# Patient Record
Sex: Female | Born: 1935 | Race: White | Hispanic: No | Smoking: Former smoker
Health system: Southern US, Community
[De-identification: ages and names within clinical notes are randomized; demographics above are authoritative.]

## PROBLEM LIST (undated history)

## (undated) DIAGNOSIS — I10 Essential (primary) hypertension: Secondary | ICD-10-CM

## (undated) DIAGNOSIS — F1011 Alcohol abuse, in remission: Secondary | ICD-10-CM

## (undated) DIAGNOSIS — K922 Gastrointestinal hemorrhage, unspecified: Secondary | ICD-10-CM

## (undated) DIAGNOSIS — E7800 Pure hypercholesterolemia, unspecified: Secondary | ICD-10-CM

## (undated) DIAGNOSIS — Z720 Tobacco use: Secondary | ICD-10-CM

## (undated) HISTORY — PX: APPENDECTOMY: SHX54

## (undated) HISTORY — PX: KNEE ARTHROSCOPY: SHX127

## (undated) HISTORY — PX: TONSILLECTOMY: SUR1361

## (undated) HISTORY — PX: CHOLECYSTECTOMY: SHX55

---

## 2015-10-31 ENCOUNTER — Encounter: Payer: Self-pay | Admitting: Medical Oncology

## 2015-10-31 ENCOUNTER — Inpatient Hospital Stay
Admission: EM | Admit: 2015-10-31 | Discharge: 2015-11-03 | DRG: 280 | Disposition: A | Discharge status: Skilled Nursing Facility | Payer: Medicare Other | Attending: Internal Medicine | Admitting: Internal Medicine

## 2015-10-31 ENCOUNTER — Emergency Department: Payer: Medicare Other

## 2015-10-31 DIAGNOSIS — R29898 Other symptoms and signs involving the musculoskeletal system: Secondary | ICD-10-CM | POA: Diagnosis present

## 2015-10-31 DIAGNOSIS — Z8249 Family history of ischemic heart disease and other diseases of the circulatory system: Secondary | ICD-10-CM

## 2015-10-31 DIAGNOSIS — I214 Non-ST elevation (NSTEMI) myocardial infarction: Principal | ICD-10-CM | POA: Diagnosis present

## 2015-10-31 DIAGNOSIS — G3184 Mild cognitive impairment, so stated: Secondary | ICD-10-CM | POA: Diagnosis present

## 2015-10-31 DIAGNOSIS — R7989 Other specified abnormal findings of blood chemistry: Secondary | ICD-10-CM

## 2015-10-31 DIAGNOSIS — Z90710 Acquired absence of both cervix and uterus: Secondary | ICD-10-CM

## 2015-10-31 DIAGNOSIS — M1990 Unspecified osteoarthritis, unspecified site: Secondary | ICD-10-CM | POA: Diagnosis present

## 2015-10-31 DIAGNOSIS — F17210 Nicotine dependence, cigarettes, uncomplicated: Secondary | ICD-10-CM | POA: Diagnosis present

## 2015-10-31 DIAGNOSIS — J209 Acute bronchitis, unspecified: Secondary | ICD-10-CM | POA: Diagnosis present

## 2015-10-31 DIAGNOSIS — R531 Weakness: Secondary | ICD-10-CM | POA: Diagnosis present

## 2015-10-31 DIAGNOSIS — I10 Essential (primary) hypertension: Secondary | ICD-10-CM | POA: Diagnosis present

## 2015-10-31 DIAGNOSIS — R778 Other specified abnormalities of plasma proteins: Secondary | ICD-10-CM

## 2015-10-31 DIAGNOSIS — W19XXXA Unspecified fall, initial encounter: Secondary | ICD-10-CM

## 2015-10-31 DIAGNOSIS — E876 Hypokalemia: Secondary | ICD-10-CM | POA: Diagnosis present

## 2015-10-31 DIAGNOSIS — Z880 Allergy status to penicillin: Secondary | ICD-10-CM | POA: Diagnosis not present

## 2015-10-31 DIAGNOSIS — Z882 Allergy status to sulfonamides status: Secondary | ICD-10-CM

## 2015-10-31 DIAGNOSIS — N390 Urinary tract infection, site not specified: Secondary | ICD-10-CM | POA: Diagnosis present

## 2015-10-31 DIAGNOSIS — I213 ST elevation (STEMI) myocardial infarction of unspecified site: Secondary | ICD-10-CM | POA: Diagnosis not present

## 2015-10-31 DIAGNOSIS — G9341 Metabolic encephalopathy: Secondary | ICD-10-CM | POA: Diagnosis present

## 2015-10-31 HISTORY — DX: Essential (primary) hypertension: I10

## 2015-10-31 HISTORY — DX: Alcohol abuse, in remission: F10.11

## 2015-10-31 HISTORY — DX: Tobacco use: Z72.0

## 2015-10-31 LAB — URINALYSIS COMPLETE WITH MICROSCOPIC (ARMC ONLY)
Bilirubin Urine: NEGATIVE
GLUCOSE, UA: NEGATIVE mg/dL
Nitrite: NEGATIVE
Protein, ur: 100 mg/dL — AB
SPECIFIC GRAVITY, URINE: 1.026 (ref 1.005–1.030)
Squamous Epithelial / LPF: NONE SEEN
pH: 5 (ref 5.0–8.0)

## 2015-10-31 LAB — TROPONIN I
TROPONIN I: 1.54 ng/mL — AB (ref <0.03)
Troponin I: 1.19 ng/mL (ref <0.03)

## 2015-10-31 LAB — LIPID PANEL
CHOL/HDL RATIO: 2.4 ratio
Cholesterol: 140 mg/dL (ref 0–200)
HDL: 59 mg/dL (ref >40)
LDL Cholesterol: 70 mg/dL (ref 0–99)
TRIGLYCERIDES: 54 mg/dL (ref <150)
VLDL: 11 mg/dL (ref 0–40)

## 2015-10-31 LAB — BASIC METABOLIC PANEL
Anion gap: 12 (ref 5–15)
BUN: 14 mg/dL (ref 6–20)
CALCIUM: 9.6 mg/dL (ref 8.9–10.3)
CO2: 23 mmol/L (ref 22–32)
CREATININE: 0.8 mg/dL (ref 0.44–1.00)
Chloride: 100 mmol/L — ABNORMAL LOW (ref 101–111)
GFR calc non Af Amer: >60 mL/min (ref >60)
Glucose, Bld: 125 mg/dL — ABNORMAL HIGH (ref 65–99)
Potassium: 3.4 mmol/L — ABNORMAL LOW (ref 3.5–5.1)
SODIUM: 135 mmol/L (ref 135–145)

## 2015-10-31 LAB — CBC
HCT: 49.8 % — ABNORMAL HIGH (ref 35.0–47.0)
Hemoglobin: 17.1 g/dL — ABNORMAL HIGH (ref 12.0–16.0)
MCH: 31.8 pg (ref 26.0–34.0)
MCHC: 34.2 g/dL (ref 32.0–36.0)
MCV: 92.8 fL (ref 80.0–100.0)
PLATELETS: 199 10*3/uL (ref 150–440)
RBC: 5.37 MIL/uL — AB (ref 3.80–5.20)
RDW: 13.1 % (ref 11.5–14.5)
WBC: 15.3 10*3/uL — ABNORMAL HIGH (ref 3.6–11.0)

## 2015-10-31 LAB — APTT: APTT: 28 s (ref 24–36)

## 2015-10-31 LAB — CK: Total CK: 1407 U/L — ABNORMAL HIGH (ref 38–234)

## 2015-10-31 LAB — PROTIME-INR
INR: 1.09
Prothrombin Time: 14.3 seconds (ref 11.4–15.0)

## 2015-10-31 LAB — TSH: TSH: 1.098 u[IU]/mL (ref 0.350–4.500)

## 2015-10-31 MED ORDER — ASPIRIN EC 325 MG PO TBEC
325.0000 mg | DELAYED_RELEASE_TABLET | Freq: Once | ORAL | Status: AC
  Administered 2015-10-31: 325 mg via ORAL

## 2015-10-31 MED ORDER — HEPARIN BOLUS VIA INFUSION
4000.0000 [IU] | Freq: Once | INTRAVENOUS | Status: AC
  Administered 2015-10-31: 4000 [IU] via INTRAVENOUS
  Filled 2015-10-31: qty 4000

## 2015-10-31 MED ORDER — LEVOFLOXACIN IN D5W 750 MG/150ML IV SOLN
750.0000 mg | INTRAVENOUS | Status: DC
  Administered 2015-10-31 – 2015-11-02 (×3): 750 mg via INTRAVENOUS
  Filled 2015-10-31 (×4): qty 150

## 2015-10-31 MED ORDER — SODIUM CHLORIDE 0.9% FLUSH
3.0000 mL | Freq: Two times a day (BID) | INTRAVENOUS | Status: DC
  Administered 2015-10-31 – 2015-11-02 (×4): 3 mL via INTRAVENOUS

## 2015-10-31 MED ORDER — ATORVASTATIN CALCIUM 20 MG PO TABS
40.0000 mg | ORAL_TABLET | Freq: Every day | ORAL | Status: DC
  Administered 2015-11-01 – 2015-11-03 (×3): 40 mg via ORAL
  Filled 2015-10-31 (×3): qty 2

## 2015-10-31 MED ORDER — ACETAMINOPHEN 650 MG RE SUPP: 650.0000 mg | Freq: Four times a day (QID) | RECTAL | Status: DC | PRN

## 2015-10-31 MED ORDER — ASPIRIN EC 81 MG PO TBEC
81.0000 mg | DELAYED_RELEASE_TABLET | Freq: Every day | ORAL | Status: DC
  Administered 2015-11-01 – 2015-11-03 (×3): 81 mg via ORAL
  Filled 2015-10-31 (×3): qty 1

## 2015-10-31 MED ORDER — ASPIRIN EC 325 MG PO TBEC
DELAYED_RELEASE_TABLET | ORAL | Status: AC
  Administered 2015-10-31: 325 mg via ORAL
  Filled 2015-10-31: qty 1

## 2015-10-31 MED ORDER — ACETAMINOPHEN 325 MG PO TABS
650.0000 mg | ORAL_TABLET | Freq: Four times a day (QID) | ORAL | Status: DC | PRN
  Administered 2015-11-03: 650 mg via ORAL
  Filled 2015-10-31: qty 2

## 2015-10-31 MED ORDER — SODIUM CHLORIDE 0.9 % IV SOLN
INTRAVENOUS | Status: AC
  Administered 2015-10-31 – 2015-11-01 (×2): via INTRAVENOUS

## 2015-10-31 MED ORDER — ONDANSETRON HCL 4 MG PO TABS
4.0000 mg | ORAL_TABLET | Freq: Four times a day (QID) | ORAL | Status: DC | PRN
  Filled 2015-10-31: qty 1

## 2015-10-31 MED ORDER — ONDANSETRON HCL 4 MG/2ML IJ SOLN: 4.0000 mg | Freq: Four times a day (QID) | INTRAMUSCULAR | Status: DC | PRN

## 2015-10-31 MED ORDER — POTASSIUM CHLORIDE CRYS ER 20 MEQ PO TBCR
40.0000 meq | EXTENDED_RELEASE_TABLET | Freq: Once | ORAL | Status: AC
Start: 2015-10-31 — End: 2015-10-31
  Administered 2015-10-31: 40 meq via ORAL
  Filled 2015-10-31: qty 2

## 2015-10-31 MED ORDER — METOPROLOL TARTRATE 25 MG PO TABS
25.0000 mg | ORAL_TABLET | Freq: Two times a day (BID) | ORAL | Status: DC
  Administered 2015-10-31 – 2015-11-01 (×2): 25 mg via ORAL
  Filled 2015-10-31 (×2): qty 1

## 2015-10-31 MED ORDER — HEPARIN (PORCINE) IN NACL 100-0.45 UNIT/ML-% IJ SOLN
800.0000 [IU]/h | INTRAMUSCULAR | Status: DC
  Administered 2015-10-31 (×2): 900 [IU]/h via INTRAVENOUS
  Filled 2015-10-31 (×2): qty 250

## 2015-10-31 NOTE — Progress Notes (Signed)
The patient is admitted to room 247 with the diagnosis of NSTEMI. Alert and oriented x 4. Denied any acute pain,  No acute respiratory distress noted. Tele box called to CCMD with a second verifier Crystal B. RN.  Patient was oriented to her room, call bell/ascom and staff. Skin assessment done with Crystal B. RN, noted redness to the left groin, skin tear to the left arm and scattered bruised on bilateral arms and sacrum. Bed alarm activated and the bed is in the lowest position. Will continue to monitor.

## 2015-10-31 NOTE — ED Provider Notes (Signed)
[facility] Emergency Department Provider Note    ____________________________________________  Time seen: ~1655  I have reviewed the triage vital signs and the nursing notes.   HISTORY  Chief Complaint Weakness   History limited by: Not Limited   HPI Joann Huang is a 82 y.o. female who presents to the emergency department brought in by daughter because of concerns for increasing weakness. The daughter states for the past month or so they have noticed some increasing weakness of the lower extremities. However for the past 2-3 days and is been acutely worse. The patient has had multiple falls. Has not been able to get herself off the floor off of the commode. The daughter has also noticed some increased confusion. The patient denies any chest pain or shortness of breath. No recent fevers. No bad odor pain to urination.     History reviewed. No pertinent past medical history.  There are no active problems to display for this patient.   No past surgical history on file.  No current outpatient prescriptions on file.  Allergies Penicillins and Sulfa antibiotics  No family history on file.  Social History Social History  Substance Use Topics  . Smoking status: Current Every Day Smoker  . Smokeless tobacco: None  . Alcohol Use: None    Review of Systems  Constitutional: Negative for fever. Cardiovascular: Negative for chest pain. Respiratory: Negative for shortness of breath. Gastrointestinal: Negative for abdominal pain, vomiting and diarrhea. Neurological: Negative for headaches, focal weakness or numbness.  10-point ROS otherwise negative.  ____________________________________________   PHYSICAL EXAM:  VITAL SIGNS: ED Triage Vitals  Enc Vitals Group     BP 10/31/15 1402 156/91 mmHg     Pulse Rate 10/31/15 1402 99     Resp 10/31/15 1402 20     Temp 10/31/15 1402 97.7 F (36.5 C)     Temp Source 10/31/15 1402 Oral     SpO2  10/31/15 1402 95 %     Weight --      Height 10/31/15 1402  (1.676 m)     Head Cir --      Peak Flow --      Pain Score 10/31/15 1402 7   Constitutional: Alert and oriented. Well appearing and in no distress. Eyes: Conjunctivae are normal. PERRL. Normal extraocular movements. ENT   Head: Normocephalic and atraumatic.   Nose: No congestion/rhinnorhea.   Mouth/Throat: Mucous membranes are moist.   Neck: No stridor. Hematological/Lymphatic/Immunilogical: No cervical lymphadenopathy. Cardiovascular: Normal rate, regular rhythm.  No murmurs, rubs, or gallops. Respiratory: Normal respiratory effort without tachypnea nor retractions. Breath sounds are clear and equal bilaterally. No wheezes/rales/rhonchi. Gastrointestinal: Soft and nontender. No distention.  Genitourinary: Deferred Musculoskeletal: Normal range of motion in all extremities. No joint effusions.  No lower extremity tenderness nor edema. Neurologic:  Normal speech and language. No gross focal neurologic deficits are appreciated.  Skin:  Skin is warm, dry and intact. No rash noted.  ____________________________________________    LABS (pertinent positives/negatives)  Labs Reviewed  BASIC METABOLIC PANEL - Abnormal; Notable for the following:    Potassium 3.4 (*)    Chloride 100 (*)    Glucose, Bld 125 (*)    All other components within normal limits  CBC - Abnormal; Notable for the following:    WBC 15.3 (*)    RBC 5.37 (*)    Hemoglobin 17.1 (*)    HCT 49.8 (*)    All other components within normal limits  TROPONIN  I - Abnormal; Notable for the following:    Troponin I 1.19 (*)    All other components within normal limits  URINALYSIS COMPLETEWITH MICROSCOPIC ([facility] ONLY)  CBG MONITORING, ED    UA pending at time of admission ____________________________________________   EKG  I, Phineas SemenGraydon Magally Vahle, attending physician, personally viewed and interpreted this EKG  EKG Time: 1415 Rate:  103 Rhythm: sinus tachycardia Axis: left axis deviation Intervals: qtc 497 QRS: narrow, q waves III, aVF, V1, V2, V3, V4 ST changes: no st elevation Impression: abnormal ekg   ____________________________________________    RADIOLOGY  CT head IMPRESSION: Age related brain atrophy and extensive white matter small vessel ischemic changes.  No acute process by noncontrast CT.  ____________________________________________   PROCEDURES  Procedure(s) performed: None  Critical Care performed: Yes, see critical care note(s)  CRITICAL CARE Performed by: Phineas SemenGOODMAN, Hula Tasso   Total critical care time: 30 minutes  Critical care time was exclusive of separately billable procedures and treating other patients.  Critical care was necessary to treat or prevent imminent or life-threatening deterioration.  Critical care was time spent personally by me on the following activities: development of treatment plan with patient and/or surrogate as well as nursing, discussions with consultants, evaluation of patient's response to treatment, examination of patient, obtaining history from patient or surrogate, ordering and performing treatments and interventions, ordering and review of laboratory studies, ordering and review of radiographic studies, pulse oximetry and re-evaluation of patient's condition.  ____________________________________________   INITIAL IMPRESSION / ASSESSMENT AND PLAN / ED COURSE  Pertinent labs & imaging results that were available during my care of the patient were reviewed by me and considered in my medical decision making (see chart for details).  Patient presented to the emergency department today accompanied by daughter because of concerns for increasing weakness. It does sound like the weakness began a worse for the past month or so however acutely worse past 2-3 days. Exam patient appears well without any concerning findings. Blood work however was concerning for  an elevated troponin. A CT head was checked given altered mental status. Patient was given aspirin. Patient will be admitted to the hospitalist service for further workup and evaluation. ____________________________________________   FINAL CLINICAL IMPRESSION(S) / ED DIAGNOSES  Final diagnoses:  Weakness  Elevated troponin     Note: This dictation was prepared with Dragon dictation. Any transcriptional errors that result from this process are unintentional    Phineas SemenGraydon Keishawn Darsey, MD 10/31/15 1911

## 2015-10-31 NOTE — ED Notes (Signed)
Pts daughter reports pt has progressively been getting weaker over the past 2 weeks. Pt fell this am, per pt she "slipped to the floor" and did not hit her head. Denies use of blood thinner. Reports pain to rt rib cage.

## 2015-10-31 NOTE — H&P (Addendum)
[facility] [place] [facility]   PATIENT NAME: Jabier Gaussudrey Haydon    MR#:  [id number]  DATE OF BIRTH:  1930-10-12  DATE OF ADMISSION:  10/31/2015  PRIMARY CARE PHYSICIAN: No PCP Per Patient   REQUESTING/REFERRING PHYSICIAN: Dr. Phineas SemenGraydon Goodman  CHIEF COMPLAINT:   Chief Complaint  Patient presents with  . Weakness    HISTORY OF PRESENT ILLNESS:   Jabier Gaussudrey Nierman  is a 82 y.o. female with a known history of Hypertension not in any medications, ongoing smoking, history of heavy alcohol abuse but quit about 25 years ago, who hasn't seen a physician in greater than 25 years comes to the hospital secondary to worsening weakness.  Daughter at bedside provides most of the history. Patient lives independently, has a cane to ambulate. Has been having progressive weakness due to arthritis over the last several months. Over the last couple of days she had trouble getting up from even her toilet seat. Patient usually does not complain anything. So the daughter is not aware if she is having any fevers or chills. Her appetite has been okay. She was noted to have increased frequency of urination and also incontinent episodes since yesterday. Has been having cough for a long time, no changes in that. Data son-in-law went to check on her and she was noted to be on the bathroom floor, conscious, fell from weakness. -White count elevated on the labs. Urine and chest x-ray are pending.  PAST MEDICAL HISTORY:   Past Medical History  Diagnosis Date  . Tobacco use   . History of alcohol abuse     quit > 25years ago  . HTN (hypertension)     Not on any medications    PAST SURGICAL HISTORY:   Past Surgical History  Procedure Laterality Date  . Appendectomy      SOCIAL HISTORY:   Social History  Substance Use Topics  . Smoking status: Current Every Day Smoker -- 1.00 packs/day    Types: Cigarettes  . Smokeless tobacco: Not on file  . Alcohol Use: No     Comment: h/o  heavy alcohol abuse, wuit 25years ago    FAMILY HISTORY:   Family History  Problem Relation Age of Onset  . CAD Mother     DRUG ALLERGIES:   Allergies  Allergen Reactions  . Penicillins   . Sulfa Antibiotics     REVIEW OF SYSTEMS:   Review of Systems  Constitutional: Positive for malaise/fatigue. Negative for fever, chills and weight loss.  HENT: Negative for ear discharge, ear pain, nosebleeds and tinnitus.   Eyes: Positive for blurred vision. Negative for double vision and photophobia.  Respiratory: Negative for cough, hemoptysis, shortness of breath and wheezing.   Cardiovascular: Negative for chest pain, palpitations, orthopnea and leg swelling.  Gastrointestinal: Positive for nausea. Negative for heartburn, vomiting, abdominal pain, diarrhea, constipation and melena.  Genitourinary: Positive for urgency and frequency. Negative for dysuria and hematuria.  Musculoskeletal: Negative for myalgias and neck pain.  Skin: Negative for rash.  Neurological: Positive for dizziness and weakness. Negative for tremors, sensory change, speech change, focal weakness and headaches.  Endo/Heme/Allergies: Does not bruise/bleed easily.  Psychiatric/Behavioral: Negative for depression.    MEDICATIONS AT HOME:   Prior to Admission medications   Not on File      VITAL SIGNS:  Blood pressure 156/91, pulse 99, temperature 97.7 F (36.5 C), temperature source Oral, resp. rate 20, height 5\' 6"  (1.676 m), weight 77 kg (169 lb 12.1 oz),  SpO2 95 %.  PHYSICAL EXAMINATION:   Physical Exam  GENERAL:  82 y.o.-year-old patient lying in the bed with no acute distress.  EYES: Pupils equal, round, reactive to light and accommodation. No scleral icterus. Extraocular muscles intact.  HEENT: Head atraumatic, normocephalic. Oropharynx and nasopharynx clear.  NECK:  Supple, no jugular venous distention. No thyroid enlargement, no tenderness.  LUNGS: Normal breath sounds bilaterally, no wheezing,  rales,rhonchi or crepitation. No use of accessory muscles of respiration. Decreased bibasilar breath sounds noted. CARDIOVASCULAR: S1, S2 normal. No murmurs, rubs, or gallops.  ABDOMEN: Soft, nontender, nondistended. Bowel sounds present. No organomegaly or mass.  EXTREMITIES: No pedal edema, cyanosis, or clubbing.  NEUROLOGIC: Cranial nerves II through XII are intact. Muscle strength 5/5 in all extremities. Sensation intact. Gait not checked. Global weakness noted. PSYCHIATRIC: The patient is alert and oriented x 3. Intermittent confusion noted. SKIN: No obvious rash, lesion, or ulcer.   LABORATORY PANEL:   CBC  Recent Labs Lab 10/31/15 1410  WBC 15.3*  HGB 17.1*  HCT 49.8*  PLT 199   ------------------------------------------------------------------------------------------------------------------  Chemistries   Recent Labs Lab 10/31/15 1410  NA 135  K 3.4*  CL 100*  CO2 23  GLUCOSE 125*  BUN 14  CREATININE 0.80  CALCIUM 9.6   ------------------------------------------------------------------------------------------------------------------  Cardiac Enzymes  Recent Labs Lab 10/31/15 1410  TROPONINI 1.19*   ------------------------------------------------------------------------------------------------------------------  RADIOLOGY:  Ct Head Wo Contrast  10/31/2015  CLINICAL DATA:  Progressive weakness, altered mental status, recent fall with head injury EXAM: CT HEAD WITHOUT CONTRAST TECHNIQUE: Contiguous axial images were obtained from the base of the skull through the vertex without intravenous contrast. COMPARISON:  None. FINDINGS: Brain: Age related brain atrophy evident with extensive chronic white matter microvascular ischemic changes throughout the cerebral hemispheres. No acute intracranial hemorrhage, mass lesion, definite new infarction, midline shift, herniation, hydrocephalus, or extra-axial fluid collection. Cisterns are patent. Cerebellar atrophy as well.  Vascular: No hyperdense vessel or unexpected calcification. Skull: Negative for fracture or focal lesion. Sinuses/Orbits: No acute findings. Other: None. IMPRESSION: Age related brain atrophy and extensive white matter small vessel ischemic changes. No acute process by noncontrast CT. Electronically Signed   By: Judie Petit.  Shick M.D.   On: 10/31/2015 17:31    EKG:   Orders placed or performed during the hospital encounter of 10/31/15  . ED EKG  . ED EKG    IMPRESSION AND PLAN:   Joletta Manner  is a 82 y.o. female with a known history of Hypertension not in any medications, ongoing smoking, history of heavy alcohol abuse but quit about 25 years ago, who hasn't seen a physician in greater than 25 years comes to the hospital secondary to worsening weakness.  #1 Weakness- r/o UTI or bronchitis - IV fluids. -Physical therapy consult - CT head with no acute findings- age related changes seen.  #2 elevated troponin-NSTEMI vs demand ischemia - no chest pain, no EKG changes - continue heparin drip, recycle troponins, monitor on tele - ECHO and cards consult - started metoprolol and statin - check CPK  #3 Leukocytosis- r/o infection  #4 Hypokalemia- being replaced  #5 HTN- not on meds at home, start metoprolol  #6 DVT Prophylaxis-- lovenox   All the records are reviewed and case discussed with ED provider. Management plans discussed with the patient, family and they are in agreement.  CODE STATUS: Full Code  TOTAL TIME TAKING CARE OF THIS PATIENT: 50 minutes.    Enid Baas M.D on 10/31/2015 at 6:38 PM  Between  7am to 6pm - Pager - [phone]  After 6pm go to [email or web address] - password [email or web address] [facility]  [facility]  Office  [phone]  CC: Primary care physician; No PCP Per Patient

## 2015-10-31 NOTE — ED Notes (Signed)
Patient transported to X-ray 

## 2015-10-31 NOTE — Consult Note (Signed)
Pharmacy Antibiotic Note  Joann Huang is a 82 y.o. female admitted on 10/31/2015 with UTI.  Pharmacy has been consulted for levofloxacin dosing.   Plan: levofloxacin 750mg  q 24 hours. Recommend for a total of 5 days  Follow cx results  Height: 5\' 6"  (167.6 cm) Weight: 161 lb 9.6 oz (73.3 kg) IBW/kg (Calculated) : 59.3  Temp (24hrs), Avg:97.7 F (36.5 C), Min:97.7 F (36.5 C), Max:97.7 F (36.5 C)   Recent Labs Lab 10/31/15 1410  WBC 15.3*  CREATININE 0.80    Estimated Creatinine Clearance: 54.6 mL/min (by C-G formula based on Cr of 0.8).    Allergies  Allergen Reactions  . Penicillins Other (See Comments)    Has patient had a PCN reaction causing immediate rash, facial/tongue/throat swelling, SOB or lightheadedness with hypotension: No Has patient had a PCN reaction causing severe rash involving mucus membranes or skin necrosis: No Has patient had a PCN reaction that required hospitalization No Has patient had a PCN reaction occurring within the last 10 years: No If all of the above answers are "NO", then may proceed with Cephalosporin use.  . Sulfa Antibiotics     Antimicrobials this admission: levofloxacin 7/21 >>    Dose adjustments this admission:   Microbiology results: 7/21 BCx: pending   Thank you for allowing pharmacy to be a part of this patient's care.  Olene FlossMelissa D [place] Pandey 10/31/2015 7:33 PM

## 2015-10-31 NOTE — Consult Note (Signed)
ANTICOAGULATION CONSULT NOTE - Initial Consult  Pharmacy Consult for heparin drip Indication: chest pain/ACS  Allergies  Allergen Reactions  . Penicillins   . Sulfa Antibiotics     Patient Measurements: Height: 5\' 6"  (167.6 cm) Weight: 169 lb 12.1 oz (77 kg) IBW/kg (Calculated) : 59.3 Heparin Dosing Weight: 73.3kg  Vital Signs: Temp: 97.7 F (36.5 C) (07/21 1402) Temp Source: Oral (07/21 1402) BP: 156/91 mmHg (07/21 1402) Pulse Rate: 99 (07/21 1402)  Labs:  Recent Labs  10/31/15 1410  HGB 17.1*  HCT 49.8*  PLT 199  CREATININE 0.80  TROPONINI 1.19*    Estimated Creatinine Clearance: 55.9 mL/min (by C-G formula based on Cr of 0.8).   Medical History: Past Medical History  Diagnosis Date  . Tobacco use   . History of alcohol abuse     quit > 25years ago  . HTN (hypertension)     Not on any medications    Medications:   (Not in a hospital admission)  Assessment: Pt is a 82 year old female who presents with increasing weakness. Pt troponin found to be elevated. Pharmacy consulted to dose heparin drip for possible ACS. Pt denies being on any anticoagulants. Baseline labs ordered  Goal of Therapy:  Heparin level 0.3-0.7 units/ml Monitor platelets by anticoagulation protocol: Yes   Plan:  Give 4000 units bolus x 1 Start heparin infusion at 900 units/hr Check anti-Xa level in 8 hours and daily while on heparin Continue to monitor H&H and platelets  Peighton Edgin D Rosaly Labarbera, Pharm.D Clinical Pharmacist   10/31/2015,6:38 PM

## 2015-11-01 ENCOUNTER — Inpatient Hospital Stay (HOSPITAL_COMMUNITY)
Admit: 2015-11-01 | Discharge: 2015-11-01 | Disposition: A | Discharge status: Home / Self Care | Payer: Medicare Other | Attending: Internal Medicine | Admitting: Internal Medicine

## 2015-11-01 DIAGNOSIS — I214 Non-ST elevation (NSTEMI) myocardial infarction: Principal | ICD-10-CM

## 2015-11-01 DIAGNOSIS — G9341 Metabolic encephalopathy: Secondary | ICD-10-CM

## 2015-11-01 DIAGNOSIS — I213 ST elevation (STEMI) myocardial infarction of unspecified site: Secondary | ICD-10-CM

## 2015-11-01 DIAGNOSIS — R29898 Other symptoms and signs involving the musculoskeletal system: Secondary | ICD-10-CM

## 2015-11-01 DIAGNOSIS — I10 Essential (primary) hypertension: Secondary | ICD-10-CM

## 2015-11-01 DIAGNOSIS — N390 Urinary tract infection, site not specified: Secondary | ICD-10-CM | POA: Diagnosis present

## 2015-11-01 LAB — BASIC METABOLIC PANEL
ANION GAP: 9 (ref 5–15)
BUN: 18 mg/dL (ref 6–20)
CHLORIDE: 101 mmol/L (ref 101–111)
CO2: 24 mmol/L (ref 22–32)
Calcium: 8.7 mg/dL — ABNORMAL LOW (ref 8.9–10.3)
Creatinine, Ser: 0.77 mg/dL (ref 0.44–1.00)
GFR calc non Af Amer: >60 mL/min (ref >60)
Glucose, Bld: 91 mg/dL (ref 65–99)
POTASSIUM: 4 mmol/L (ref 3.5–5.1)
SODIUM: 134 mmol/L — AB (ref 135–145)

## 2015-11-01 LAB — CBC
HEMATOCRIT: 45.7 % (ref 35.0–47.0)
HEMOGLOBIN: 15.7 g/dL (ref 12.0–16.0)
MCH: 31.7 pg (ref 26.0–34.0)
MCHC: 34.4 g/dL (ref 32.0–36.0)
MCV: 92 fL (ref 80.0–100.0)
Platelets: 165 10*3/uL (ref 150–440)
RBC: 4.97 MIL/uL (ref 3.80–5.20)
RDW: 13 % (ref 11.5–14.5)
WBC: 17.5 10*3/uL — AB (ref 3.6–11.0)

## 2015-11-01 LAB — ECHOCARDIOGRAM COMPLETE
HEIGHTINCHES: 66 in
Weight: 2585.55 oz

## 2015-11-01 LAB — TROPONIN I
TROPONIN I: 1.5 ng/mL — AB (ref <0.03)
TROPONIN I: 1.8 ng/mL — AB (ref <0.03)

## 2015-11-01 LAB — HEPARIN LEVEL (UNFRACTIONATED)
HEPARIN UNFRACTIONATED: 0.72 [IU]/mL — AB (ref 0.30–0.70)
Heparin Unfractionated: 0.63 IU/mL (ref 0.30–0.70)

## 2015-11-01 MED ORDER — GUAIFENESIN-DM 100-10 MG/5ML PO SYRP: 5.0000 mL | ORAL_SOLUTION | ORAL | Status: DC | PRN

## 2015-11-01 MED ORDER — METOPROLOL SUCCINATE ER 50 MG PO TB24
50.0000 mg | ORAL_TABLET | Freq: Every day | ORAL | Status: DC
  Administered 2015-11-01 – 2015-11-03 (×3): 50 mg via ORAL
  Filled 2015-11-01 (×3): qty 1

## 2015-11-01 MED ORDER — ENOXAPARIN SODIUM 80 MG/0.8ML ~~LOC~~ SOLN
1.0000 mg/kg | Freq: Two times a day (BID) | SUBCUTANEOUS | Status: AC
  Administered 2015-11-01 – 2015-11-02 (×3): 75 mg via SUBCUTANEOUS
  Filled 2015-11-01 (×3): qty 0.8

## 2015-11-01 MED ORDER — IPRATROPIUM-ALBUTEROL 0.5-2.5 (3) MG/3ML IN SOLN: 3.0000 mL | RESPIRATORY_TRACT | Status: DC | PRN

## 2015-11-01 MED ORDER — HYDRALAZINE HCL 20 MG/ML IJ SOLN
10.0000 mg | Freq: Four times a day (QID) | INTRAMUSCULAR | Status: DC | PRN
  Administered 2015-11-01 – 2015-11-02 (×2): 10 mg via INTRAVENOUS
  Filled 2015-11-01 (×2): qty 1

## 2015-11-01 MED ORDER — ENOXAPARIN SODIUM 40 MG/0.4ML ~~LOC~~ SOLN
40.0000 mg | SUBCUTANEOUS | Status: DC
  Administered 2015-11-03: 40 mg via SUBCUTANEOUS
  Filled 2015-11-01: qty 0.4

## 2015-11-01 MED ORDER — IPRATROPIUM-ALBUTEROL 0.5-2.5 (3) MG/3ML IN SOLN
3.0000 mL | RESPIRATORY_TRACT | Status: DC
  Filled 2015-11-01: qty 3

## 2015-11-01 MED ORDER — PREDNISONE 50 MG PO TABS
50.0000 mg | ORAL_TABLET | Freq: Every day | ORAL | Status: DC
  Administered 2015-11-01 – 2015-11-03 (×3): 50 mg via ORAL
  Filled 2015-11-01 (×2): qty 1

## 2015-11-01 MED ORDER — AMLODIPINE BESYLATE 5 MG PO TABS
5.0000 mg | ORAL_TABLET | Freq: Every day | ORAL | Status: DC
  Administered 2015-11-01 – 2015-11-03 (×3): 5 mg via ORAL
  Filled 2015-11-01 (×3): qty 1

## 2015-11-01 NOTE — Progress Notes (Signed)
Physical Therapy Evaluation Patient Details Name: JENNA ROUTZAHN MRN: [id number] DOB: 1931-01-23 Today's Date: 11/01/2015   History of Present Illness  Patient is an 82 y.o. female admitted on 31 October 2015 for progressive weakness and worsening urinary frequency/incontinence. PMH includes HTN, smoking, and alcohol abuse. Has not seen a doctor in 25 years.  Clinical Impression  Patient is a pleasant 82 y.o. Female who is slightly agitated upon PT entering room for evaluation, stating she wants to return home. Upon evaluation, patient demonstrates slight levels of confusion where PT was unable to determine if these were baseline deficits. For example, saying she received breakfast at 1 P.M. And being unable to differentiate right from left. Patient required moderate assistance to perform bed mobility, lacking ability to follow multi-step or one step commands for hand placement from PT. Upon sitting, CNA entered room and took patient's BP which was recorded as 189/114 mmHg. Took again in opposite arm with similar reading. Nurse notified, and PT returned patient to supine. While PT was unable to assess mobility with [facility], it is believed that patient demonstrated enough deficits in strength/mobility during bed mobility to require further f/u of skilled and progressive PT. Patient may also benefit from placement at SNF until better able to demonstrate independence with bed mobility/transfers/household ambulation.    Follow Up Recommendations SNF    Equipment Recommendations  Rolling walker with 5" wheels    Recommendations for Other Services       Precautions / Restrictions Precautions Precautions: Fall Restrictions Weight Bearing Restrictions: No      Mobility  Bed Mobility Overal bed mobility: Needs Assistance Bed Mobility: Supine to Sit;Sit to Supine     Supine to sit: Max assist Sit to supine: Mod assist   General bed mobility comments: Patient demonstrates inability to follow  commands to move from supine to sit, having tendency to grab PT's hands/arms. Required moderate assistance with LEs and to move to [facility] once returning to supine.  Transfers Overall transfer level:  (Unable to assess due to BP)                  Ambulation/Gait             General Gait Details: Unable to assess due to BP  Stairs            Wheelchair Mobility    Modified Rankin (Stroke Patients Only)       Balance Overall balance assessment: Needs assistance Sitting-balance support: Feet supported Sitting balance-Leahy Scale: Fair                                       Pertinent Vitals/Pain Pain Assessment: No/denies pain    Home Living Family/patient expects to be discharged to:: Private residence Living Arrangements: Alone Available Help at Discharge: Family;Available PRN/intermittently;Other (Comment) (Daughter lives 15 mins away) Type of Home: Apartment Home Access: Level entry     Home Layout: One level Home Equipment: Cane - single point Additional Comments: Patient bathes in sink. Uses SPC for household ambulation on R.    Prior Function Level of Independence: Needs assistance   Gait / Transfers Assistance Needed: Modified independent with SPC  ADL's / Homemaking Assistance Needed: Modified independence with [facility]; daughter performs grocery shopping        Hand Dominance        Extremity/Trunk Assessment   Upper Extremity Assessment: Overall WFL for tasks  assessed           Lower Extremity Assessment: Overall WFL for tasks assessed         Communication   Communication: No difficulties  Cognition Arousal/Alertness: Awake/alert Behavior During Therapy: WFL for tasks assessed/performed;Agitated Overall Cognitive Status: Difficult to assess       Memory: Decreased short-term memory              General Comments      Exercises        Assessment/Plan    PT Assessment Patient needs continued PT  services  PT Diagnosis Difficulty walking   PT Problem List Decreased strength;Decreased activity tolerance;Decreased balance;Decreased cognition;Decreased knowledge of use of DME;Decreased safety awareness;Cardiopulmonary status limiting activity;Decreased mobility  PT Treatment Interventions DME instruction;Gait training;Functional mobility training;Therapeutic activities;Therapeutic exercise;Balance training;Cognitive remediation;Patient/family education   PT Goals (Current goals can be found in the Care Plan section) Acute Rehab PT Goals Patient Stated Goal: "To get out of here." PT Goal Formulation: With patient Time For Goal Achievement: 11/15/15 Potential to Achieve Goals: Good    Frequency Min 2X/week   Barriers to discharge Decreased caregiver support      Co-evaluation               End of Session   Activity Tolerance: Treatment limited secondary to medical complications (Comment) Patient left: in bed;with call bell/phone within reach;with bed alarm set Nurse Communication: Other (comment) (BP)         Time: 8757-9728 PT Time Calculation (min) (ACUTE ONLY): 27 min   Charges:   PT Evaluation $PT Eval Low Complexity: 1 Procedure     PT G Codes:        Neita Carp, PT, DPT 11/01/2015, 12:20 PM

## 2015-11-01 NOTE — Consult Note (Signed)
ANTICOAGULATION CONSULT NOTE - FOLLOW UP  Pharmacy Consult for heparin drip Indication: chest pain/ACS  Allergies  Allergen Reactions  . Sulfa Antibiotics Hives and Swelling  . Penicillins Hives and Other (See Comments)    Has patient had a PCN reaction causing immediate rash, facial/tongue/throat swelling, SOB or lightheadedness with hypotension: yes Has patient had a PCN reaction causing severe rash involving mucus membranes or skin necrosis: No Has patient had a PCN reaction that required hospitalization No Has patient had a PCN reaction occurring within the last 10 years: No If all of the above answers are "NO", then may proceed with Cephalosporin use.    Patient Measurements: Height: 5\' 6"  (167.6 cm) Weight: 161 lb 9.6 oz (73.3 kg) IBW/kg (Calculated) : 59.3 Heparin Dosing Weight: 73.3kg  Vital Signs: Temp: 97.8 F (36.6 C) (07/22 1136) Temp Source: Oral (07/22 1136) BP: 189/114 mmHg (07/22 1136) Pulse Rate: 80 (07/22 1136)  Labs:  Recent Labs  10/31/15 1410 10/31/15 1918 10/31/15 2046 11/01/15 0329 11/01/15 0837 11/01/15 1248  HGB 17.1*  --   --  15.7  --   --   HCT 49.8*  --   --  45.7  --   --   PLT 199  --   --  165  --   --   APTT  --  28  --   --   --   --   LABPROT  --  14.3  --   --   --   --   INR  --  1.09  --   --   --   --   HEPARINUNFRC  --   --   --  0.72*  --  0.63  CREATININE 0.80  --   --  0.77  --   --   CKTOTAL  --   --  1407*  --   --   --   TROPONINI 1.19*  --  1.54* 1.50* 1.80*  --     Estimated Creatinine Clearance: 54.6 mL/min (by C-G formula based on Cr of 0.77).   Medical History: Past Medical History  Diagnosis Date  . Tobacco use   . History of alcohol abuse     quit > 25years ago  . HTN (hypertension)     Not on any medications    Medications:  Prescriptions prior to admission  Medication Sig Dispense Refill Last Dose  . acetaminophen (TYLENOL) 650 MG CR tablet Take 1,300 mg by mouth daily.   10/31/2015 at Unknown  time    Assessment: Pt is a 82 year old female who presents with increasing weakness. Pt troponin found to be elevated. Pharmacy consulted to dose heparin drip for possible ACS. Pt denies being on any anticoagulants. Baseline labs ordered  Goal of Therapy:  Heparin level 0.3-0.7 units/ml Monitor platelets by anticoagulation protocol: Yes   Plan:  First heparin level supratherapeutic. Decrease rate to 800 units/hr, will recheck heparin level in 8 hours.  7/22 @ 12:48 Heparin level resulted 0.63. Will continue heparin gtt at current rate. Will recheck heparin level @ 08:00. Pharmacy to follow.   Demetrius Charity, PharmD  BCPS Clinical Pharmacist 11/01/2015,2:12 PM

## 2015-11-01 NOTE — Care Management Important Message (Signed)
Important Message  Patient Details  Name: Joann Huang MRN: [id number] Date of Birth: 07-21-1930   Medicare Important Message Given:  Yes    Lawerance Sabal, RN 11/01/2015, 12:13 PMImportant Message  Patient Details  Name: Joann Huang MRN: [id number] Date of Birth: 20-Mar-1931   Medicare Important Message Given:  Yes    Lawerance Sabal, RN 11/01/2015, 12:13 PM

## 2015-11-01 NOTE — Consult Note (Signed)
ANTICOAGULATION CONSULT NOTE - Initial Consult  Pharmacy Consult for heparin drip Indication: chest pain/ACS  Allergies  Allergen Reactions  . Sulfa Antibiotics Hives and Swelling  . Penicillins Hives and Other (See Comments)    Has patient had a PCN reaction causing immediate rash, facial/tongue/throat swelling, SOB or lightheadedness with hypotension: yes Has patient had a PCN reaction causing severe rash involving mucus membranes or skin necrosis: No Has patient had a PCN reaction that required hospitalization No Has patient had a PCN reaction occurring within the last 10 years: No If all of the above answers are "NO", then may proceed with Cephalosporin use.    Patient Measurements: Height: 5\' 6"  (167.6 cm) Weight: 161 lb 9.6 oz (73.3 kg) IBW/kg (Calculated) : 59.3 Heparin Dosing Weight: 73.3kg  Vital Signs: Temp: 97.7 F (36.5 C) (07/21 2048) Temp Source: Oral (07/21 2048) BP: 172/83 mmHg (07/21 2048) Pulse Rate: 71 (07/21 2048)  Labs:  Recent Labs  10/31/15 1410 10/31/15 1918 10/31/15 2046 11/01/15 0329  HGB 17.1*  --   --  15.7  HCT 49.8*  --   --  45.7  PLT 199  --   --  165  APTT  --  28  --   --   LABPROT  --  14.3  --   --   INR  --  1.09  --   --   HEPARINUNFRC  --   --   --  0.72*  CREATININE 0.80  --   --  0.77  CKTOTAL  --   --  1407*  --   TROPONINI 1.19*  --  1.54* 1.50*    Estimated Creatinine Clearance: 54.6 mL/min (by C-G formula based on Cr of 0.77).   Medical History: Past Medical History  Diagnosis Date  . Tobacco use   . History of alcohol abuse     quit > 25years ago  . HTN (hypertension)     Not on any medications    Medications:  Prescriptions prior to admission  Medication Sig Dispense Refill Last Dose  . acetaminophen (TYLENOL) 650 MG CR tablet Take 1,300 mg by mouth daily.   10/31/2015 at Unknown time    Assessment: Pt is a 82 year old female who presents with increasing weakness. Pt troponin found to be elevated.  Pharmacy consulted to dose heparin drip for possible ACS. Pt denies being on any anticoagulants. Baseline labs ordered  Goal of Therapy:  Heparin level 0.3-0.7 units/ml Monitor platelets by anticoagulation protocol: Yes   Plan:  First heparin level supratherapeutic. Decrease rate to 800 units/hr, will recheck heparin level in 8 hours.  Ludger Bones A. [place], [place].D., BCPS Clinical Pharmacist 11/01/2015,4:53 AM

## 2015-11-01 NOTE — Progress Notes (Signed)
Patient refused svn treatment again. She states her breathing is fine and she is not here because of her breathing. Mild rhonci upper lobes that clear with cough. Room air saturation 94% HR 97 RR 22

## 2015-11-01 NOTE — Progress Notes (Signed)
[facility] -  at [facility]   PATIENT NAME: Joann Huang    MR#:  [id number]  DATE OF BIRTH:  Sep 21, 1930  SUBJECTIVE:  CHIEF COMPLAINT:   Chief Complaint  Patient presents with  . Weakness   Afebrile. Continues to feel weak. Dry cough. Has been confused on and off as per daughter.  REVIEW OF SYSTEMS:    Review of Systems  Constitutional: Positive for malaise/fatigue. Negative for fever and chills.  HENT: Negative for sore throat.   Eyes: Negative for blurred vision, double vision and pain.  Respiratory: Positive for cough. Negative for hemoptysis, shortness of breath and wheezing.   Cardiovascular: Negative for chest pain, palpitations, orthopnea and leg swelling.  Gastrointestinal: Negative for heartburn, nausea, vomiting, abdominal pain, diarrhea and constipation.  Genitourinary: Negative for dysuria and hematuria.  Musculoskeletal: Negative for back pain and joint pain.  Skin: Negative for rash.  Neurological: Positive for weakness. Negative for sensory change, speech change, focal weakness and headaches.  Endo/Heme/Allergies: Does not bruise/bleed easily.  Psychiatric/Behavioral: Positive for hallucinations and memory loss. Negative for depression. The patient is not nervous/anxious.     DRUG ALLERGIES:   Allergies  Allergen Reactions  . Sulfa Antibiotics Hives and Swelling  . Penicillins Hives and Other (See Comments)    Has patient had a PCN reaction causing immediate rash, facial/tongue/throat swelling, SOB or lightheadedness with hypotension: yes Has patient had a PCN reaction causing severe rash involving mucus membranes or skin necrosis: No Has patient had a PCN reaction that required hospitalization No Has patient had a PCN reaction occurring within the last 10 years: No If all of the above answers are "NO", then may proceed with Cephalosporin use.    VITALS:  Blood pressure 189/114, pulse 80, temperature 97.8 F (36.6 C),  temperature source Oral, resp. rate 19, height 5\' 6"  (1.676 m), weight 73.3 kg (161 lb 9.6 oz), SpO2 98 %.  PHYSICAL EXAMINATION:   Physical Exam  GENERAL:  82 y.o.-year-old patient lying in the bed with no acute distress.  EYES: Pupils equal, round, reactive to light and accommodation. No scleral icterus. Extraocular muscles intact.  HEENT: Head atraumatic, normocephalic. Oropharynx and nasopharynx clear.  NECK:  Supple, no jugular venous distention. No thyroid enlargement, no tenderness.  LUNGS: Normal breath sounds bilaterally, no wheezing, rales, rhonchi. No use of accessory muscles of respiration.  CARDIOVASCULAR: S1, S2 normal. No murmurs, rubs, or gallops.  ABDOMEN: Soft, nontender, nondistended. Bowel sounds present. No organomegaly or mass.  EXTREMITIES: No cyanosis, clubbing or edema b/l.    NEUROLOGIC: Cranial nerves II through XII are intact. No focal  sensory deficits b/l.   Lower extremity motor strength is 4/5. PSYCHIATRIC: The patient is alert and awake. SKIN: No obvious rash, lesion, or ulcer.   LABORATORY PANEL:   CBC  Recent Labs Lab 11/01/15 0329  WBC 17.5*  HGB 15.7  HCT 45.7  PLT 165   ------------------------------------------------------------------------------------------------------------------ Chemistries   Recent Labs Lab 11/01/15 0329  NA 134*  K 4.0  CL 101  CO2 24  GLUCOSE 91  BUN 18  CREATININE 0.77  CALCIUM 8.7*   ------------------------------------------------------------------------------------------------------------------  Cardiac Enzymes  Recent Labs Lab 11/01/15 0837  TROPONINI 1.80*   ------------------------------------------------------------------------------------------------------------------  RADIOLOGY:  Dg Chest 2 View  10/31/2015  CLINICAL DATA:  Initial encounter for Pts daughter states she is getting weaker for the last two weeks now. Per pt she lifting something heacy a week ago and heard a popping sound.  Pt is complaining  of right sided rib pain since then. Smoker. Hx of HTN EXAM: CHEST  2 VIEW COMPARISON:  None. FINDINGS: Hyperinflation. Midline trachea. Normal heart size. Atherosclerosis in the transverse aorta. No pleural effusion or pneumothorax. There may be mild left base scarring. IMPRESSION: No acute cardiopulmonary disease. Aortic atherosclerosis. Electronically Signed   By: Jeronimo Greaves M.D.   On: 10/31/2015 19:19   Ct Head Wo Contrast  10/31/2015  CLINICAL DATA:  Progressive weakness, altered mental status, recent fall with head injury EXAM: CT HEAD WITHOUT CONTRAST TECHNIQUE: Contiguous axial images were obtained from the base of the skull through the vertex without intravenous contrast. COMPARISON:  None. FINDINGS: Brain: Age related brain atrophy evident with extensive chronic white matter microvascular ischemic changes throughout the cerebral hemispheres. No acute intracranial hemorrhage, mass lesion, definite new infarction, midline shift, herniation, hydrocephalus, or extra-axial fluid collection. Cisterns are patent. Cerebellar atrophy as well. Vascular: No hyperdense vessel or unexpected calcification. Skull: Negative for fracture or focal lesion. Sinuses/Orbits: No acute findings. Other: None. IMPRESSION: Age related brain atrophy and extensive white matter small vessel ischemic changes. No acute process by noncontrast CT. Electronically Signed   By: Judie Petit.  Shick M.D.   On: 10/31/2015 17:31     ASSESSMENT AND PLAN:   Joann Huang is a 82 y.o. female with a known history of Hypertension not in any medications, ongoing smoking, history of heavy alcohol abuse but quit about 25 years ago, who hasn't seen a physician in greater than 25 years comes to the hospital secondary to worsening weakness.  # UTI with weakness and acute encephalopathy On IV antibiotics. -Physical therapy consult - CT head with no acute findings- age related changes seen. She likely has mild cognitive  impairment.  # elevated troponin-NSTEMI vs rhabdomyolysis Discussed with Dr. Herbie [place] of cardiology [facility] continue anticoagulation for total 48 hours. We'll change heparin drip to Lovenox. Continue aspirin and beta blocker. Echocardiogram showed no wall motion abnormalities.  # Acute bronchitis Start prednisone and as needed breathing treatments.  # Hypokalemia- being replaced  # HTN- not on meds at home Will start Toprol and Norvasc Hydralazine PRN  # DVT Prophylaxis-- On lovenox  # Weakness This has been progressively worsening. Acute worsening due to UTI Will have physical therapy see the patient. May need rehabilitation at discharge.  All the records are reviewed and case discussed with Care Management/Social Workerr. Management plans discussed with the patient, family and they are in agreement.  CODE STATUS: FULL CODE  DVT Prophylaxis: SCDs  TOTAL TIME TAKING CARE OF THIS PATIENT: 35 minutes.   POSSIBLE D/C IN 2-3 DAYS, DEPENDING ON CLINICAL CONDITION.  Milagros Loll R M.D on 11/01/2015 at 2:35 PM  Between 7am to 6pm - Pager - [phone]  After 6pm go to [email or web address] - password [email or web address] [facility]  [facility]  Office  [phone]  CC: Primary care physician; No PCP Per Patient  Note: This dictation was prepared with Dragon dictation along with smaller phrase technology. Any transcriptional errors that result from this process are unintentional.

## 2015-11-01 NOTE — Consult Note (Signed)
Cardiology Consult    Patient ID: Joann Huang MRN: [id number], DOB/AGE: [birth year]   Admit date: 10/31/2015 Date of Consult: 11/01/2015  Primary Physician: No PCP Per Patient Primary Cardiologist: None. Requesting Provider: Milagros Loll, MD - Internal Medicine Hospitalist Service  Patient Profile    82 year old woman with a history of hypertension but not on any medications, long-term smoker without a diagnosis of COPD who has also been bothered by significant osteoporosis related pains and weakness. Has some balance issues and is been using a cane to ambulate., But has noted progressively worsening balance issues.  History of Present Illness   Joann Huang presented to the emergency room yesterday having been down the floor in her bathroom. She's been noticing progressively worsening balance issues and weakness.   History obtained from chart and discussion with the patient's daughter.--   These symptoms have been exacerbated over the last couple weeks as indicated by discussion with her daughter (the patient herself is a poor historian).  Apparently a couple days ago she was so weak that she could not get off the toilet, the following day she was actually found by this son-in-law on the floor in the bathroom. She states that she thought that she may been on before for at least 3 hours before was found. She denies any sensation of chest tightness pressure or dyspnea, but is a very poor historian. She has been having some more congestion and coughing, but has not had PND or orthopnea. No rapid irregular heartbeats or palpitations. There was no loss of consciousness. She has noted that her appetite has been down for a while and has not been eating and drinking as well as usual.  Cardiovascular ROS: positive for - Weakness and cough with chronic baseline dyspnea negative for - chest pain, edema, irregular heartbeat, loss of consciousness, orthopnea, palpitations, paroxysmal nocturnal dyspnea,  rapid heart rate or TIA/amaurosis fugax   Past Medical History   Past Medical History  Diagnosis Date  . Tobacco use   . History of alcohol abuse     quit > 25years ago  . HTN (hypertension)     Not on any medications    Past Surgical History  Procedure Laterality Date  . Appendectomy       Allergies  Allergies  Allergen Reactions  . Sulfa Antibiotics Hives and Swelling  . Penicillins Hives and Other (See Comments)    Has patient had a PCN reaction causing immediate rash, facial/tongue/throat swelling, SOB or lightheadedness with hypotension: yes Has patient had a PCN reaction causing severe rash involving mucus membranes or skin necrosis: No Has patient had a PCN reaction that required hospitalization No Has patient had a PCN reaction occurring within the last 10 years: No If all of the above answers are "NO", then may proceed with Cephalosporin use.    Inpatient Medications    . amLODipine  5 mg Oral Daily  . aspirin EC  81 mg Oral Daily  . atorvastatin  40 mg Oral q1800  . [START ON 11/03/2015] enoxaparin (LOVENOX) injection  40 mg Subcutaneous Q24H  . enoxaparin (LOVENOX) injection  1 mg/kg Subcutaneous Q12H  . ipratropium-albuterol  3 mL Nebulization Q4H  . levofloxacin (LEVAQUIN) IV  750 mg Intravenous Q24H  . metoprolol succinate  50 mg Oral Daily  . predniSONE  50 mg Oral Q breakfast  . sodium chloride flush  3 mL Intravenous Q12H    Family History    Family History  Problem Relation Age of Onset  .  CAD Mother     Social History    Social History   Social History  . Marital Status: Widowed    Spouse Name: N/A  . Number of Children: N/A  . Years of Education: N/A   Occupational History  . Not on file.   Social History Main Topics  . Smoking status: Current Every Day Smoker -- 1.00 packs/day    Types: Cigarettes  . Smokeless tobacco: Not on file  . Alcohol Use: No     Comment: h/o heavy alcohol abuse, wuit 25years ago  . Drug Use: No  .  Sexual Activity: Not on file   Other Topics Concern  . Not on file   Social History Narrative   Lives at home by herself. Has a cane     Review of Systems    Review of Systems  Constitutional: Negative for fever and chills.  HENT: Positive for congestion.   Respiratory: Positive for cough (Cough with some congestion) and sputum production (Only white).   Cardiovascular:       Negative per history of present illness  Gastrointestinal: Positive for nausea. Negative for vomiting, diarrhea, constipation, blood in stool and melena.  Genitourinary: Positive for urgency (Per history of present illness).       Unable to obtain, no description of dysuria or hematuria  Musculoskeletal: Positive for joint pain (Knees and hips) and falls.  Neurological: Positive for dizziness and weakness (Global weakness, mostly leg and hip related). Negative for focal weakness and loss of consciousness.  Endo/Heme/Allergies: Does not bruise/bleed easily.  Psychiatric/Behavioral: Positive for hallucinations (Per discussion with the daughter, she has been talking to people that are not there. Also having some confabulation.) and memory loss.   All other systems reviewed and are otherwise negative except as noted above.  Physical Exam    Blood pressure 189/114, pulse 80, temperature 97.8 F (36.6 C), temperature source Oral, resp. rate 19, height 5\' 6"  (1.676 m), weight 161 lb 9.6 oz (73.3 kg), SpO2 98 %.  General: Pleasant, NAD.  Sitting up in bed trying to eat her meal.  Psych: somewhat blunted abrupt affect. Sometimes does not respond take questions or follow commands. Just Stares at me.  Neuro: Alert and oriented  to person and family members, is unsure of her location.  Moves all extremities spontaneously. CNII- XII grossly intact  HEENT: [place]/AT, EOMI, MMM, anicteric sclera  Neck: Supple without bruits or JVD. Lungs:  Resp regular and unlabored, CTA. Heart: RRR, normal S1 and S2. no s3, s4, or  murmurs. Abdomen: Soft, non-tender, non-distended, BS + x 4.  Extremities: No clubbing, cyanosis or edema. DP/PT/Radials 2+ and equal bilaterally.  Labs    Troponin (Point of Care Test) No results for input(s): TROPIPOC in the last 72 hours.  Recent Labs  10/31/15 1410 10/31/15 2046 11/01/15 0329 11/01/15 0837  CKTOTAL  --  1407*  --   --   TROPONINI 1.19* 1.54* 1.50* 1.80*   Lab Results  Component Value Date   WBC 17.5* 11/01/2015   HGB 15.7 11/01/2015   HCT 45.7 11/01/2015   MCV 92.0 11/01/2015   PLT 165 11/01/2015    Recent Labs Lab 11/01/15 0329  NA 134*  K 4.0  CL 101  CO2 24  BUN 18  CREATININE 0.77  CALCIUM 8.7*  GLUCOSE 91   Lab Results  Component Value Date   CHOL 140 10/31/2015   HDL 59 10/31/2015   LDLCALC 70 10/31/2015   TRIG 54 10/31/2015  No results found for: [facility]   Radiology Studies    Dg Chest 2 View  10/31/2015  CLINICAL DATA:  Initial encounter for Pts daughter states she is getting weaker for the last two weeks now. Per pt she lifting something heacy a week ago and heard a popping sound. Pt is complaining of right sided rib pain since then. Smoker. Hx of HTN EXAM: CHEST  2 VIEW COMPARISON:  None. FINDINGS: Hyperinflation. Midline trachea. Normal heart size. Atherosclerosis in the transverse aorta. No pleural effusion or pneumothorax. There may be mild left base scarring. IMPRESSION: No acute cardiopulmonary disease. Aortic atherosclerosis. Electronically Signed   By: Jeronimo Greaves M.D.   On: 10/31/2015 19:19   Ct Head Wo Contrast  10/31/2015  CLINICAL DATA:  Progressive weakness, altered mental status, recent fall with head injury EXAM: CT HEAD WITHOUT CONTRAST TECHNIQUE: Contiguous axial images were obtained from the base of the skull through the vertex without intravenous contrast. COMPARISON:  None. FINDINGS: Brain: Age related brain atrophy evident with extensive chronic white matter microvascular ischemic changes throughout the cerebral  hemispheres. No acute intracranial hemorrhage, mass lesion, definite new infarction, midline shift, herniation, hydrocephalus, or extra-axial fluid collection. Cisterns are patent. Cerebellar atrophy as well. Vascular: No hyperdense vessel or unexpected calcification. Skull: Negative for fracture or focal lesion. Sinuses/Orbits: No acute findings. Other: None. IMPRESSION: Age related brain atrophy and extensive white matter small vessel ischemic changes. No acute process by noncontrast CT. Electronically Signed   By: Judie Petit.  Shick M.D.   On: 10/31/2015 17:31    ECG & Cardiac Imaging     EKG 10/31/2015: Sinus tachycardia, rate 103. Likely left atrial enlargement with criteria for LVH. Poor anteriorly progression - cannot exclude anterior infarct, age indeterminant. Also inferior Q waves, cannot exclude inferior MI, age indeterminate.  otherwise normal axis, intervals and durations.    Transthoracic Echo 11/01/2015: Normal LV size with mild concentric LVH. Normal function EF 60-65% with no regional wall motion abnormality. GR 1 DD. Otherwise essentially normal.  Assessment & Plan    Principal Problem:   Weakness of both lower extremities - ? Baseline weakness & OA exacerbated by UTI/Bronchitis; PT/OT  - will likely need at lest short term, if not full time SNF    NSTEMI (non-ST elevated myocardial infarction) (HCC) vs. [place].  Plan is for medical management  It's hard to tell exactly what the etiology for the troponin elevation is. There clearly is no classic symptomatology for cardiac event. No chest tightness or pressure no dyspnea. No heart failure. Her echocardiogram is normal. EKG is essentially benign (then no regional wall motion abnormality would exclude prior infarct).  Mild LVH is easily explained by other hypertension.  Cannot exclude possibility of mild rhabdomyolysis as etiology since the CK is very elevated. The trend is very unusual also.  As we cannot be certain the true  etiology, I think is fine to treat with anticoagulation for about 2 days. Agree with switching to Lovenox.  Would do aspirin and statin as well as beta blocker.  After long discussion with the patient's daughter, I don't think that we need to look into any invasive or noninvasive ischemic evaluation at this point in time especially in light of her encephalopathy. Perhaps as an outpatient this can be considered.      Essential hypertension - appears to be somewhat difficult to control   With questionable cardiac ischemia, I agree with beta blocker and calcium channel blocker.  would not titrate to fast.  Since there is a potential cardiac etiology, nitroglycerin would also be acceptable.     UTI (urinary tract infection) - with Encephalopathy; Abx - per hospitalist service   I will reevaluate tomorrow, but will likely follow peripherally.   Long discussion with the patient and her daughter about potential etiology of the troponin elevation and their desires for further evaluation. Greater than 50% of the time was spent in direct consultation. Total time with patient and family 60 minutes.   Signed, Marykay Lex, M.D., M.S.  [facility]  [address] [place], [place] [address] [phone] Fax [phone]

## 2015-11-02 LAB — CBC
HEMATOCRIT: 42.7 % (ref 35.0–47.0)
HEMOGLOBIN: 14.7 g/dL (ref 12.0–16.0)
MCH: 31.7 pg (ref 26.0–34.0)
MCHC: 34.4 g/dL (ref 32.0–36.0)
MCV: 92.3 fL (ref 80.0–100.0)
Platelets: 138 10*3/uL — ABNORMAL LOW (ref 150–440)
RBC: 4.62 MIL/uL (ref 3.80–5.20)
RDW: 12.8 % (ref 11.5–14.5)
WBC: 16.4 10*3/uL — AB (ref 3.6–11.0)

## 2015-11-02 LAB — BASIC METABOLIC PANEL
Anion gap: 9 (ref 5–15)
BUN: 20 mg/dL (ref 6–20)
CHLORIDE: 98 mmol/L — AB (ref 101–111)
CO2: 23 mmol/L (ref 22–32)
CREATININE: 0.6 mg/dL (ref 0.44–1.00)
Calcium: 8.6 mg/dL — ABNORMAL LOW (ref 8.9–10.3)
GFR calc Af Amer: >60 mL/min (ref >60)
GLUCOSE: 85 mg/dL (ref 65–99)
POTASSIUM: 3.4 mmol/L — AB (ref 3.5–5.1)
SODIUM: 130 mmol/L — AB (ref 135–145)

## 2015-11-02 LAB — CBC WITH DIFFERENTIAL/PLATELET
Basophils Absolute: 0.1 10*3/uL (ref 0–0.1)
Basophils Relative: 0 %
EOS ABS: 0.1 10*3/uL (ref 0–0.7)
EOS PCT: 0 %
HCT: 44.9 % (ref 35.0–47.0)
Hemoglobin: 15.6 g/dL (ref 12.0–16.0)
LYMPHS ABS: 1.4 10*3/uL (ref 1.0–3.6)
LYMPHS PCT: 8 %
MCH: 31.8 pg (ref 26.0–34.0)
MCHC: 34.7 g/dL (ref 32.0–36.0)
MCV: 91.7 fL (ref 80.0–100.0)
MONO ABS: 1.6 10*3/uL — AB (ref 0.2–0.9)
MONOS PCT: 9 %
Neutro Abs: 14.6 10*3/uL — ABNORMAL HIGH (ref 1.4–6.5)
Neutrophils Relative %: 83 %
PLATELETS: 157 10*3/uL (ref 150–440)
RBC: 4.9 MIL/uL (ref 3.80–5.20)
RDW: 12.9 % (ref 11.5–14.5)
WBC: 17.8 10*3/uL — ABNORMAL HIGH (ref 3.6–11.0)

## 2015-11-02 LAB — MAGNESIUM: Magnesium: 1.7 mg/dL (ref 1.7–2.4)

## 2015-11-02 LAB — CK: Total CK: 1027 U/L — ABNORMAL HIGH (ref 38–234)

## 2015-11-02 NOTE — Progress Notes (Signed)
[facility] - [place] at [facility]   PATIENT NAME: Joann Huang    MR#:  [id number]  DATE OF BIRTH:  08/09/1930  SUBJECTIVE:  CHIEF COMPLAINT:   Chief Complaint  Patient presents with  . Weakness   Feels better today. Weakness persists.  REVIEW OF SYSTEMS:    Review of Systems  Constitutional: Positive for malaise/fatigue. Negative for fever and chills.  HENT: Negative for sore throat.   Eyes: Negative for blurred vision, double vision and pain.  Respiratory: Positive for cough. Negative for hemoptysis, shortness of breath and wheezing.   Cardiovascular: Negative for chest pain, palpitations, orthopnea and leg swelling.  Gastrointestinal: Negative for heartburn, nausea, vomiting, abdominal pain, diarrhea and constipation.  Genitourinary: Negative for dysuria and hematuria.  Musculoskeletal: Negative for back pain and joint pain.  Skin: Negative for rash.  Neurological: Positive for weakness. Negative for sensory change, speech change, focal weakness and headaches.  Endo/Heme/Allergies: Does not bruise/bleed easily.  Psychiatric/Behavioral: Positive for hallucinations and memory loss. Negative for depression. The patient is not nervous/anxious.     DRUG ALLERGIES:   Allergies  Allergen Reactions  . Sulfa Antibiotics Hives and Swelling  . Penicillins Hives and Other (See Comments)    Has patient had a PCN reaction causing immediate rash, facial/tongue/throat swelling, SOB or lightheadedness with hypotension: yes Has patient had a PCN reaction causing severe rash involving mucus membranes or skin necrosis: No Has patient had a PCN reaction that required hospitalization No Has patient had a PCN reaction occurring within the last 10 years: No If all of the above answers are "NO", then may proceed with Cephalosporin use.    VITALS:  Blood pressure (!) 164/89, pulse 75, temperature 98.1 F (36.7 C), temperature source Oral, resp. rate 18, height   (1.676 m), weight 73.3 kg (161 lb 9.6 oz), SpO2 96 %.  PHYSICAL EXAMINATION:   Physical Exam  GENERAL:  82 y.o.-year-old patient lying in the bed with no acute distress.  EYES: Pupils equal, round, reactive to light and accommodation. No scleral icterus. Extraocular muscles intact.  HEENT: Head atraumatic, normocephalic. Oropharynx and nasopharynx clear.  NECK:  Supple, no jugular venous distention. No thyroid enlargement, no tenderness.  LUNGS: Normal breath sounds bilaterally, no wheezing, rales, rhonchi. No use of accessory muscles of respiration.  CARDIOVASCULAR: S1, S2 normal. No murmurs, rubs, or gallops.  ABDOMEN: Soft, nontender, nondistended. Bowel sounds present. No organomegaly or mass.  EXTREMITIES: No cyanosis, clubbing or edema b/l.    NEUROLOGIC: Cranial nerves II through XII are intact. No focal  sensory deficits b/l.   Lower extremity motor strength is 4/5. PSYCHIATRIC: The patient is alert and awake. SKIN: No obvious rash, lesion, or ulcer.   LABORATORY PANEL:   CBC  Recent Labs Lab 11/02/15 0800  WBC 16.4*  HGB 14.7  HCT 42.7  PLT 138*   ------------------------------------------------------------------------------------------------------------------ Chemistries   Recent Labs Lab 11/02/15 0457  NA 130*  K 3.4*  CL 98*  CO2 23  GLUCOSE 85  BUN 20  CREATININE 0.60  CALCIUM 8.6*   ------------------------------------------------------------------------------------------------------------------  Cardiac Enzymes  Recent Labs Lab 11/01/15 0837  TROPONINI 1.80*   ------------------------------------------------------------------------------------------------------------------  RADIOLOGY:  Dg Chest 2 View  Result Date: 10/31/2015 CLINICAL DATA:  Initial encounter for Pts daughter states she is getting weaker for the last two weeks now. Per pt she lifting something heacy a week ago and heard a popping sound. Pt is complaining of right sided rib  pain since then. Smoker. Hx  of HTN EXAM: CHEST  2 VIEW COMPARISON:  None. FINDINGS: Hyperinflation. Midline trachea. Normal heart size. Atherosclerosis in the transverse aorta. No pleural effusion or pneumothorax. There may be mild left base scarring. IMPRESSION: No acute cardiopulmonary disease. Aortic atherosclerosis. Electronically Signed   By: Jeronimo Greaves M.D.   On: 10/31/2015 19:19   Ct Head Wo Contrast  Result Date: 10/31/2015 CLINICAL DATA:  Progressive weakness, altered mental status, recent fall with head injury EXAM: CT HEAD WITHOUT CONTRAST TECHNIQUE: Contiguous axial images were obtained from the base of the skull through the vertex without intravenous contrast. COMPARISON:  None. FINDINGS: Brain: Age related brain atrophy evident with extensive chronic white matter microvascular ischemic changes throughout the cerebral hemispheres. No acute intracranial hemorrhage, mass lesion, definite new infarction, midline shift, herniation, hydrocephalus, or extra-axial fluid collection. Cisterns are patent. Cerebellar atrophy as well. Vascular: No hyperdense vessel or unexpected calcification. Skull: Negative for fracture or focal lesion. Sinuses/Orbits: No acute findings. Other: None. IMPRESSION: Age related brain atrophy and extensive white matter small vessel ischemic changes. No acute process by noncontrast CT. Electronically Signed   By: Judie Petit.  Shick M.D.   On: 10/31/2015 17:31     ASSESSMENT AND PLAN:   Joann Huang is a 82 y.o. female with a known history of Hypertension not in any medications, ongoing smoking, history of heavy alcohol abuse but quit about 25 years ago, who hasn't seen a physician in greater than 25 years comes to the hospital secondary to worsening weakness.  # UTI with weakness and acute encephalopathy On IV antibiotics. -Physical therapy consult - CT head with no acute findings- age related changes seen. She likely has mild cognitive impairment.  # Elevated  troponin-NSTEMI vs rhabdomyolysis Discussed with Dr. Herbie Baltimore of cardiology [facility] continue anticoagulation for total 48 hours. On Lovenox Continue aspirin and beta blocker. Echocardiogram showed no wall motion abnormalities. Will need outpatient stress test  # Acute bronchitis - improving Started prednisone and as needed breathing treatments.  # Hypokalemia- replaced  # HTN- not on meds at home Will start Toprol and Norvasc Hydralazine PRN  # DVT Prophylaxis-- On lovenox  # Weakness This has been progressively worsening. Acute worsening due to UTI Seen by physical therapy .SNF at discharge   All the records are reviewed and case discussed with Care Management/Social Workerr. Management plans discussed with the patient, family and they are in agreement.  Discussed with daughter over the phone.  CODE STATUS: FULL CODE  DVT Prophylaxis: SCDs  TOTAL TIME TAKING CARE OF THIS PATIENT: 35 minutes.   POSSIBLE D/C IN 2-3 DAYS, DEPENDING ON CLINICAL CONDITION.  Milagros Loll R M.D on 11/02/2015 at 11:11 AM  Between 7am to 6pm - Pager - [phone]  After 6pm go to [email or web address] - password [email or web address] [facility]  [facility]  Office  [phone]  CC: Primary care physician; No PCP Per Patient  Note: This dictation was prepared with Dragon dictation along with smaller phrase technology. Any transcriptional errors that result from this process are unintentional.

## 2015-11-02 NOTE — Progress Notes (Signed)
    Patient briefly seen and evaluated today chart reviewed. Remains somewhat confused. Without family members around, still not able to get a good history of symptoms. She denies chest pain.  She is hemodynamically stable. Vital signs are stable on stable regimen.  See full consult note from yesterday. I think stopping anticoagulation to 48 hours is probably reasonable. As per discussion with the patient's daughter yesterday, I don't think further additional ischemic evaluation is warranted at this time.  Her daughter's husband sees Dr. Mariah Milling, and the family would like for Joann Huang to follow-up with him if necessary. I think it would not be unreasonable to have at least one outpatient evaluation to determine if it is even reasonable to consider outpatient nuclear stress test.   Will pass on to our schedulers to have this appointment arranged.  With no active cardiac symptoms. I will sign off for now.   Joann Huang, M.D., M.S.  [facility]  [address] [place], [place] [address] [phone] Fax [phone]

## 2015-11-02 NOTE — Clinical Social Work Note (Signed)
Clinical Social Work Assessment  Patient Details  Name: Joann Huang MRN: [id number] Date of Birth: 10/12/1930  Date of referral:  11/02/15               Reason for consult:  Discharge Planning                Permission sought to share information with:  Family Supports Permission granted to share information::  Yes, Verbal Permission Granted  Name::        Agency::     Relationship::   Joann Huang; Joann Huang )  Contact Information:     Housing/Transportation Living arrangements for the past 2 months:  Single Family Home Source of Information:  Adult Children, Patient Joann Huang; Joann Huang ) Patient Interpreter Needed:  None Criminal Activity/Legal Involvement Pertinent to Current Situation/Hospitalization:  No - Comment as needed Significant Relationships:  Adult Children Joann Huang; Joann Huang ) Lives with:  Self Do you feel safe going back to the place where you live?  Yes Need for family participation in patient care:  Yes (Comment) Joann Huang; Joann Huang )  Care giving concerns:  Patient and her family are interested in SNF placement at discharge.    Social Worker assessment / plan:  CSW met with patient, Joann Huang; Joann Huang at bedside. Introduced herself and her role. Per patient's Huang they feel that patient needs SNF at discharge. Stated eventually they'd like to transition patient into LTC or ALF but feels she needs to get her strength back up. Inquired about LTC medicaid. CSW provided [employer] and Adult Medicaid [place] list. CSW encouraged patient's family to go to the [facility] to begin patient's Medicaid application. CSW provided patient and her family with a list of SNFs in the area. They report their preference would be [facility]. Granted CSW verbal permission to send SNF referrals to SNFs in [place]. FL2/ PASRR completed, placed in MDs basket for cosign and faxed  to SNFs in [place]. Awaiting bed offers.   Employment status:  Retired Forensic scientist:  Medicare PT Recommendations:  [place] Overlook / Referral to community resources:  [place]  Patient/Family's Response to care:  Patient and her family are agreeable to SNF at discharge.   Patient/Family's Understanding of and Emotional Response to Diagnosis, Current Treatment, and Prognosis:  Patient and her family report they under stand patient's Diagnosis, Current Treatment, and Prognosis. Reported they feel that STR is the best option for patient at this time and appreciates CSW's assistance.   Emotional Assessment Appearance:  Appears stated age Attitude/Demeanor/Rapport:   (None) Affect (typically observed):  Accepting, Calm, Pleasant Orientation:  Oriented to Self, Oriented to Place, Oriented to Situation Alcohol / Substance use:  Not Applicable Psych involvement (Current and /or in the community):  No (Comment)  Discharge Needs  Concerns to be addressed:  Discharge Planning Concerns Readmission within the last 30 days:  No Current discharge risk:  Chronically ill Barriers to Discharge:  Continued Medical Work up   [employer], LCSW 11/02/2015, 4:24 PM

## 2015-11-02 NOTE — NC FL2 (Signed)
[place] MEDICAID FL2 LEVEL OF CARE SCREENING TOOL     IDENTIFICATION  Patient Name: Joann Huang Birthdate: [birth year] Sex: female Admission Date (Current Location): 10/31/2015  [place] and [place] Number:  Chiropodist and Address:  [facility], [address], [place], [place] [address]      Provider Number: [id number]  Attending Physician Name and Address:  Milagros Loll, MD  Relative Name and Phone Number:       Current Level of Care: Hospital Recommended Level of Care: Skilled Nursing Facility Prior Approval Number:    Date Approved/Denied:   PASRR Number:  [id number])  Discharge Plan: SNF    Current Diagnoses: Patient Active Problem List   Diagnosis Date Noted  . Weakness of both lower extremities 11/01/2015  . UTI (urinary tract infection) - with encephalopathy 11/01/2015  . Essential hypertension 11/01/2015  . Metabolic encephalopathy - related to UTI 11/01/2015  . NSTEMI (non-ST elevated myocardial infarction) (HCC) 10/31/2015    Orientation RESPIRATION BLADDER Height & Weight     Self, Place  Normal Incontinent Weight: 161 lb 9.6 oz (73.3 kg) Height:   (167.6 cm)  BEHAVIORAL SYMPTOMS/MOOD NEUROLOGICAL BOWEL NUTRITION STATUS   (None)  (None) Incontinent Diet (Heart)  AMBULATORY STATUS COMMUNICATION OF NEEDS Skin   Extensive Assist Verbally Other (Comment) (Incision Left Arm)                       Personal Care Assistance Level of Assistance  Bathing, Feeding, Dressing Bathing Assistance: Limited assistance Feeding assistance: Independent Dressing Assistance: Limited assistance     Functional Limitations Info  Sight, Hearing, Speech Sight Info: Adequate Hearing Info: Adequate Speech Info: Adequate    SPECIAL CARE FACTORS FREQUENCY  PT (By licensed PT), OT (By licensed OT)     PT Frequency:  (5) OT Frequency:  (5)            Contractures      Additional Factors Info  Code Status,  Allergies Code Status Info:  (Full Code) Allergies Info:  (Sulfa Antibiotics, Penicillins)           Current Medications (11/02/2015):  This is the current hospital active medication list Current Facility-Administered Medications  Medication Dose Route Frequency Provider Last Rate Last Dose  . acetaminophen (TYLENOL) tablet 650 mg  650 mg Oral Q6H PRN Enid Baas, MD       Or  . acetaminophen (TYLENOL) suppository 650 mg  650 mg Rectal Q6H PRN Enid Baas, MD      . amLODipine (NORVASC) tablet 5 mg  5 mg Oral Daily Srikar Sudini, MD   5 mg at 11/02/15 0930  . aspirin EC tablet 81 mg  81 mg Oral Daily Enid Baas, MD   81 mg at 11/02/15 0931  . atorvastatin (LIPITOR) tablet 40 mg  40 mg Oral q1800 Enid Baas, MD   40 mg at 11/01/15 1858  . [START ON 11/03/2015] enoxaparin (LOVENOX) injection 40 mg  40 mg Subcutaneous Q24H Srikar Sudini, MD      . enoxaparin (LOVENOX) injection 75 mg  1 mg/kg Subcutaneous Q12H Srikar Sudini, MD   75 mg at 11/02/15 0600  . guaiFENesin-dextromethorphan (ROBITUSSIN DM) 100-10 MG/5ML syrup 5 mL  5 mL Oral Q4H PRN Srikar Sudini, MD      . hydrALAZINE (APRESOLINE) injection 10 mg  10 mg Intravenous Q6H PRN Milagros Loll, MD   10 mg at 11/02/15 0930  . ipratropium-albuterol (DUONEB) 0.5-2.5 (3) MG/3ML  nebulizer solution 3 mL  3 mL Nebulization Q4H PRN Srikar Sudini, MD      . levofloxacin (LEVAQUIN) IVPB 750 mg  750 mg Intravenous Q24H Olene Floss, RPH   750 mg at 11/01/15 2136  . metoprolol succinate (TOPROL-XL) 24 hr tablet 50 mg  50 mg Oral Daily Milagros Loll, MD   50 mg at 11/02/15 0931  . ondansetron (ZOFRAN) tablet 4 mg  4 mg Oral Q6H PRN Enid Baas, MD       Or  . ondansetron (ZOFRAN) injection 4 mg  4 mg Intravenous Q6H PRN Enid Baas, MD      . predniSONE (DELTASONE) tablet 50 mg  50 mg Oral Q breakfast Milagros Loll, MD   50 mg at 11/02/15 0900  . sodium chloride flush (NS) 0.9 % injection 3 mL  3 mL Intravenous  Q12H Enid Baas, MD   3 mL at 11/01/15 2136     Discharge Medications: Please see discharge summary for a list of discharge medications.  Relevant Imaging Results:  Relevant Lab Results:   Additional Information  (SSN [id number])  Verta Ellen Sunkins, LCSW

## 2015-11-03 LAB — BASIC METABOLIC PANEL
Anion gap: 10 (ref 5–15)
BUN: 32 mg/dL — AB (ref 6–20)
CHLORIDE: 100 mmol/L — AB (ref 101–111)
CO2: 21 mmol/L — ABNORMAL LOW (ref 22–32)
CREATININE: 0.61 mg/dL (ref 0.44–1.00)
Calcium: 8.6 mg/dL — ABNORMAL LOW (ref 8.9–10.3)
Glucose, Bld: 88 mg/dL (ref 65–99)
POTASSIUM: 3.3 mmol/L — AB (ref 3.5–5.1)
SODIUM: 131 mmol/L — AB (ref 135–145)

## 2015-11-03 LAB — CBC WITH DIFFERENTIAL/PLATELET
BASOS ABS: 0.1 10*3/uL (ref 0–0.1)
Basophils Relative: 0 %
EOS ABS: 0 10*3/uL (ref 0–0.7)
EOS PCT: 0 %
HCT: 38.7 % (ref 35.0–47.0)
HEMOGLOBIN: 13.3 g/dL (ref 12.0–16.0)
LYMPHS PCT: 11 %
Lymphs Abs: 1.6 10*3/uL (ref 1.0–3.6)
MCH: 31.8 pg (ref 26.0–34.0)
MCHC: 34.4 g/dL (ref 32.0–36.0)
MCV: 92.4 fL (ref 80.0–100.0)
Monocytes Absolute: 2 10*3/uL — ABNORMAL HIGH (ref 0.2–0.9)
Monocytes Relative: 13 %
NEUTROS PCT: 76 %
Neutro Abs: 11.5 10*3/uL — ABNORMAL HIGH (ref 1.4–6.5)
PLATELETS: 170 10*3/uL (ref 150–440)
RBC: 4.19 MIL/uL (ref 3.80–5.20)
RDW: 13 % (ref 11.5–14.5)
WBC: 15.2 10*3/uL — AB (ref 3.6–11.0)

## 2015-11-03 LAB — CK: CK TOTAL: 577 U/L — AB (ref 38–234)

## 2015-11-03 MED ORDER — PREDNISONE 50 MG PO TABS: 50.0000 mg | ORAL_TABLET | Freq: Every day | ORAL | 0 refills | Status: AC

## 2015-11-03 MED ORDER — AMLODIPINE BESYLATE 5 MG PO TABS: 5.0000 mg | ORAL_TABLET | Freq: Every day | ORAL | 0 refills | Status: DC

## 2015-11-03 MED ORDER — ASPIRIN 81 MG PO TBEC: 81.0000 mg | DELAYED_RELEASE_TABLET | Freq: Every day | ORAL | 0 refills | Status: DC

## 2015-11-03 MED ORDER — METOPROLOL SUCCINATE ER 50 MG PO TB24: 50.0000 mg | ORAL_TABLET | Freq: Every day | ORAL | 0 refills | Status: AC

## 2015-11-03 MED ORDER — LEVOFLOXACIN 500 MG PO TABS: 500.0000 mg | ORAL_TABLET | Freq: Every day | ORAL | 0 refills | Status: AC

## 2015-11-03 MED ORDER — ATORVASTATIN CALCIUM 40 MG PO TABS: 40.0000 mg | ORAL_TABLET | Freq: Every day | ORAL | 0 refills | Status: DC

## 2015-11-03 MED ORDER — POTASSIUM CHLORIDE CRYS ER 20 MEQ PO TBCR
20.0000 meq | EXTENDED_RELEASE_TABLET | Freq: Two times a day (BID) | ORAL | Status: DC
  Filled 2015-11-03: qty 1

## 2015-11-03 NOTE — Progress Notes (Signed)
No complaints of pain or distress noted this shift.  Rhonchi in lung fields noted.  Pt noncompliant with requests to deep breath and cough.

## 2015-11-03 NOTE — Clinical Social Work Placement (Signed)
   CLINICAL SOCIAL WORK PLACEMENT  NOTE  Date:  11/03/2015  Patient Details  Name: Joann Huang MRN: [id number] Date of Birth: Feb 12, 1931  Clinical Social Work is seeking post-discharge placement for this patient at the Skilled  Nursing Facility level of care (*CSW will initial, date and re-position this form in  chart as items are completed):  Yes   Patient/family provided with [place] Clinical Social Work Department's list of facilities offering this level of care within the geographic area requested by the patient (or if unable, by the patient's family).  Yes   Patient/family informed of their freedom to choose among providers that offer the needed level of care, that participate in Medicare, Medicaid or managed care program needed by the patient, have an available bed and are willing to accept the patient.  Yes   Patient/family informed of [facility]'s ownership interest in [facility] and [facility], as well as of the fact that they are under no obligation to receive care at these facilities.  PASRR submitted to EDS on 11/03/15     PASRR number received on 11/03/15     Existing PASRR number confirmed on       FL2 transmitted to all facilities in geographic area requested by pt/family on 11/03/15     FL2 transmitted to all facilities within larger geographic area on       Patient informed that his/her managed care company has contracts with or will negotiate with certain facilities, including the following:        Yes   Patient/family informed of bed offers received.  Patient chooses bed at  [facility])     Physician recommends and patient chooses bed at      Patient to be transferred to  [facility]) on 11/03/15.  Patient to be transferred to facility by  (EMS)     Patient family notified on 11/03/15 of transfer.  Name of family member notified:   [name]- Daughter)     PHYSICIAN       Additional Comment:     _______________________________________________ Idamae Lusher, LCSW 11/03/2015, 2:23 PM

## 2015-11-03 NOTE — Care Management Important Message (Signed)
Important Message  Patient Details  Name: Joann Huang MRN: [id number] Date of Birth: Aug 18, 1930   Medicare Important Message Given:  Yes    Marily Memos, RN 11/03/2015, 9:58 AM

## 2015-11-03 NOTE — Progress Notes (Signed)
CSW contacted patient's daughter [name] over the phone [phone]). Presented bed offers. Accepted bed at [facility]. CSW informed Counsellor at [facility]. CSW will continue to follow and assist.  Woodroe Mode, MSW, LCSW, LCAS-A Clinical Social Worker [phone]

## 2015-11-03 NOTE — Progress Notes (Signed)
Clinical Social Worker was informed that patient will be medically ready to discharge to [facility] . Patient in a agreement with plan. CSW called Raiford Noble- Admissions Coordinator at [facility] to confirm that patient's bed is ready. Provided patient's room number E-15 and number to call for report [phone]. All discharge information faxed to [facility] via HUB. Call to patient's daughter- [name], informed her patient would discharge to [facility]. RN will call report and patient will discharge to [facility] via EMS.  Woodroe Mode, MSW, LCSW, LCAS-A Clinical Social Worker [phone]

## 2015-11-03 NOTE — Progress Notes (Signed)
Patient discharged to [facility] via EMS,vital signs within normal limits upon discharge

## 2015-11-03 NOTE — Discharge Summary (Signed)
[facility] - [place] at [facility]   PATIENT NAME: Joann Huang    MR#:  [id number]  DATE OF BIRTH:  1930/05/26  DATE OF ADMISSION:  10/31/2015 ADMITTING PHYSICIAN: Enid Baas, MD  DATE OF DISCHARGE: 11/03/2015  PRIMARY CARE PHYSICIAN: No PCP Per Patient    ADMISSION DIAGNOSIS:  Weakness [R53.1] Fall [W19.XXXA] Elevated troponin [R79.89]  DISCHARGE DIAGNOSIS:  Principal Problem:   Weakness of both lower extremities Active Problems:   NSTEMI (non-ST elevated myocardial infarction) (HCC)   UTI (urinary tract infection) - with encephalopathy   Essential hypertension   Metabolic encephalopathy - related to UTI   SECONDARY DIAGNOSIS:   Past Medical History:  Diagnosis Date  . History of alcohol abuse    quit > 25years ago  . HTN (hypertension)    Not on any medications  . Tobacco use     HOSPITAL COURSE:   # UTI with weakness and acute encephalopathy On IV antibiotics. -Physical therapy consult - CT head with no acute findings- age related changes seen. She likely has mild cognitive impairment.  levaquine oral on discharge.  # Elevated troponin-NSTEMI  Discussed with Dr. Herbie Baltimore of cardiology given anticoagulation for total 48 hours. On Lovenox Continue aspirin and beta blocker. Echocardiogram showed no wall motion abnormalities. Will need outpatient cardiology follow ups.  # Acute bronchitis - improving Started prednisone and as needed breathing treatments.  # Hypokalemia- replaced  # HTN- not on meds at home  Toprol and Norvasc Hydralazine PRN  # DVT Prophylaxis-- On lovenox  # Weakness This has been progressively worsening. Acute worsening due to UTI Seen by physical therapy .SNF at discharge  DISCHARGE CONDITIONS:   Stable.  CONSULTS OBTAINED:  Treatment Team:  Milagros Loll, MD  DRUG ALLERGIES:   Allergies  Allergen Reactions  . Sulfa Antibiotics Hives and Swelling  . Penicillins Hives and Other  (See Comments)    Has patient had a PCN reaction causing immediate rash, facial/tongue/throat swelling, SOB or lightheadedness with hypotension: yes Has patient had a PCN reaction causing severe rash involving mucus membranes or skin necrosis: No Has patient had a PCN reaction that required hospitalization No Has patient had a PCN reaction occurring within the last 10 years: No If all of the above answers are "NO", then may proceed with Cephalosporin use.    DISCHARGE MEDICATIONS:   Current Discharge Medication List    START taking these medications   Details  amLODipine (NORVASC) 5 MG tablet Take 1 tablet (5 mg total) by mouth daily. Qty: 30 tablet, Refills: 0    aspirin EC 81 MG EC tablet Take 1 tablet (81 mg total) by mouth daily. Qty: 30 tablet, Refills: 0    atorvastatin (LIPITOR) 40 MG tablet Take 1 tablet (40 mg total) by mouth daily at 6 PM. Qty: 30 tablet, Refills: 0    levofloxacin (LEVAQUIN) 500 MG tablet Take 1 tablet (500 mg total) by mouth daily. Qty: 3 tablet, Refills: 0    metoprolol succinate (TOPROL-XL) 50 MG 24 hr tablet Take 1 tablet (50 mg total) by mouth daily. Take with or immediately following a meal. Qty: 30 tablet, Refills: 0    predniSONE (DELTASONE) 50 MG tablet Take 1 tablet (50 mg total) by mouth daily with breakfast. Qty: 2 tablet, Refills: 0      CONTINUE these medications which have NOT CHANGED   Details  acetaminophen (TYLENOL) 650 MG CR tablet Take 1,300 mg by mouth daily.  DISCHARGE INSTRUCTIONS:    Follow with Dr. Mariah Milling in 1-2 weeks.  If you experience worsening of your admission symptoms, develop shortness of breath, life threatening emergency, suicidal or homicidal thoughts you must seek medical attention immediately by calling 911 or calling your MD immediately  if symptoms less severe.  You Must read complete instructions/literature along with all the possible adverse reactions/side effects for all the Medicines you take  and that have been prescribed to you. Take any new Medicines after you have completely understood and accept all the possible adverse reactions/side effects.   Please note  You were cared for by a hospitalist during your hospital stay. If you have any questions about your discharge medications or the care you received while you were in the hospital after you are discharged, you can call the unit and asked to speak with the hospitalist on call if the hospitalist that took care of you is not available. Once you are discharged, your primary care physician will handle any further medical issues. Please note that NO REFILLS for any discharge medications will be authorized once you are discharged, as it is imperative that you return to your primary care physician (or establish a relationship with a primary care physician if you do not have one) for your aftercare needs so that they can reassess your need for medications and monitor your lab values.    Today   CHIEF COMPLAINT:   Chief Complaint  Patient presents with  . Weakness    HISTORY OF PRESENT ILLNESS:  Marigny Borre  is a 82 y.o. female with a known history of Hypertension not in any medications, ongoing smoking, history of heavy alcohol abuse but quit about 25 years ago, who hasn't seen a physician in greater than 25 years comes to the hospital secondary to worsening weakness.  Daughter at bedside provides most of the history. Patient lives independently, has a cane to ambulate. Has been having progressive weakness due to arthritis over the last several months. Over the last couple of days she had trouble getting up from even her toilet seat. Patient usually does not complain anything. So the daughter is not aware if she is having any fevers or chills. Her appetite has been okay. She was noted to have increased frequency of urination and also incontinent episodes since yesterday. Has been having cough for a long time, no changes in that. Data  son-in-law went to check on her and she was noted to be on the bathroom floor, conscious, fell from weakness. -White count elevated on the labs. Urine and chest x-ray are pending.  VITAL SIGNS:  Blood pressure 127/82, pulse 73, temperature 97.6 F (36.4 C), temperature source Oral, resp. rate 19, height 5\' 6"  (1.676 m), weight 73.3 kg (161 lb 9.6 oz), SpO2 92 %.  I/O:   Intake/Output Summary (Last 24 hours) at 11/03/15 1320 Last data filed at 11/02/15 2100  Gross per 24 hour  Intake              150 ml  Output                0 ml  Net              150 ml    PHYSICAL EXAMINATION:   GENERAL:  82 y.o.-year-old patient lying in the bed with no acute distress.  EYES: Pupils equal, round, reactive to light and accommodation. No scleral icterus. Extraocular muscles intact.  HEENT: Head atraumatic, normocephalic. Oropharynx and nasopharynx clear.  NECK:  Supple, no jugular venous distention. No thyroid enlargement, no tenderness.  LUNGS: Normal breath sounds bilaterally, no wheezing, rales, rhonchi. No use of accessory muscles of respiration.  CARDIOVASCULAR: S1, S2 normal. No murmurs, rubs, or gallops.  ABDOMEN: Soft, nontender, nondistended. Bowel sounds present. No organomegaly or mass.  EXTREMITIES: No cyanosis, clubbing or edema b/l.    NEUROLOGIC: Cranial nerves II through XII are intact. No focal  sensory deficits b/l.   Lower extremity motor strength is 4/5. PSYCHIATRIC: The patient is alert and awake. SKIN: No obvious rash, lesion, or ulcer.  DATA REVIEW:   CBC  Recent Labs Lab 11/03/15 0517  WBC 15.2*  HGB 13.3  HCT 38.7  PLT 170    Chemistries   Recent Labs Lab 11/02/15 0457 11/03/15 0517  NA 130* 131*  K 3.4* 3.3*  CL 98* 100*  CO2 23 21*  GLUCOSE 85 88  BUN 20 32*  CREATININE 0.60 0.61  CALCIUM 8.6* 8.6*  MG 1.7  --     Cardiac Enzymes  Recent Labs Lab 11/01/15 0837  TROPONINI 1.80*    Microbiology Results  Results for orders placed or  performed during the hospital encounter of 10/31/15  Urine culture     Status: Abnormal (Preliminary result)   Collection Time: 10/31/15  6:19 PM  Result Value Ref Range Status   Specimen Description URINE, RANDOM  Final   Special Requests NONE  Final   Culture (A)  Final    >=100,000 COLONIES/mL GRAM NEGATIVE RODS REPEATING ID AND SUSCEPTIBILITIES Performed at [facility]    Report Status PENDING  Incomplete    RADIOLOGY:  No results found.  EKG:   Orders placed or performed during the hospital encounter of 10/31/15  . ED EKG  . ED EKG      Management plans discussed with the patient, family and they are in agreement.  CODE STATUS:     Code Status Orders        Start     Ordered   10/31/15 2028  Full code  Continuous     10/31/15 2027    Code Status History    Date Active Date Inactive Code Status Order ID Comments User Context   This patient has a current code status but no historical code status.    Advance Directive Documentation   Flowsheet Row Most Recent Value  Type of Advance Directive  Healthcare Power of Attorney  Pre-existing out of facility DNR order (yellow form or pink MOST form)  No data  "MOST" Form in Place?  No data      TOTAL TIME TAKING CARE OF THIS PATIENT: 35 minutes.    Altamese Dilling M.D on 11/03/2015 at 1:20 PM  Between 7am to 6pm - Pager - [phone]  After 6pm go to [email or web address] - Social research officer, government  [facility] [place] [facility]  Office  [phone]  CC: Primary care physician; No PCP Per Patient   Note: This dictation was prepared with Dragon dictation along with smaller phrase technology. Any transcriptional errors that result from this process are unintentional.

## 2015-11-03 NOTE — Progress Notes (Signed)
Physical Therapy Treatment Patient Details Name: Joann Huang MRN: [id number] DOB: Feb 02, 1931 Today's Date: 11/03/2015    History of Present Illness Patient is an 82 y.o. female admitted on 31 October 2015 for progressive weakness and worsening urinary frequency/incontinence. PMH includes HTN, smoking, and alcohol abuse. Has not seen a doctor in 25 years.    PT Comments    Pt agreeable to PT, but bed exercises only. Pt notes feeling extremely hot and pain in left hip. Pt given fan and cool cloths. Pt instructed in and performs Bilateral lower extremities exercises with assist and rest as needed. Pt instructed to continue exercises throughout the day and instructed in benefits of moving Left lower extremity to prevent stiffness and gain range and strength to reduce pain and gain function. Continue PT to progress strength and endurance to improve all functional mobility.   Follow Up Recommendations  SNF     Equipment Recommendations  Rolling walker with 5" wheels    Recommendations for Other Services       Precautions / Restrictions Restrictions Weight Bearing Restrictions: No    Mobility  Bed Mobility               General bed mobility comments: Not tested; refuses out of bed  Transfers                    Ambulation/Gait                 Stairs            Wheelchair Mobility    Modified Rankin (Stroke Patients Only)       Balance                                    Cognition Arousal/Alertness: Awake/alert Behavior During Therapy: WFL for tasks assessed/performed;Agitated Overall Cognitive Status: Within Functional Limits for tasks assessed                      Exercises General Exercises - Lower Extremity Ankle Circles/Pumps: AROM;Both;15 reps;Supine Quad Sets: Strengthening;Both;10 reps;Supine Gluteal Sets: Strengthening;Both;10 reps;Supine Short Arc Quad: AROM;Both;10 reps;Supine Heel Slides: AROM;Both;10  reps;Supine Hip ABduction/ADduction: AROM;Right;10 reps;Supine (AAROM on L) Straight Leg Raises: AAROM;Both;10 reps;Supine    General Comments        Pertinent Vitals/Pain Pain Assessment: 0-10 Pain Score: 7  Pain Location: L hip/thigh    Home Living                      Prior Function            PT Goals (current goals can now be found in the care plan section) Progress towards PT goals: Progressing toward goals (slowly)    Frequency  Min 2X/week    PT Plan Current plan remains appropriate    Co-evaluation             End of Session   Activity Tolerance: Patient limited by fatigue;Patient limited by pain (Heat; given fan and cool cloths) Patient left: in bed;with call bell/phone within reach;with bed alarm set     Time: 1100-1127 PT Time Calculation (min) (ACUTE ONLY): 27 min  Charges:  $Therapeutic Exercise: 23-37 mins                    G Codes:      Kristeen Miss 11/03/2015, 11:36 AM

## 2015-11-03 NOTE — Progress Notes (Signed)
Pt to be discharged to [facility] today iv and tele removed. Report called to kinara at the facility. Pt to be transported via ems.

## 2015-11-07 LAB — URINE CULTURE

## 2015-11-25 ENCOUNTER — Encounter: Payer: Self-pay | Admitting: Emergency Medicine

## 2015-11-25 ENCOUNTER — Emergency Department
Admission: EM | Admit: 2015-11-25 | Discharge: 2015-11-25 | Disposition: A | Discharge status: Other Acute Inpatient Hospital | Payer: Medicare Other | Attending: Emergency Medicine | Admitting: Emergency Medicine

## 2015-11-25 ENCOUNTER — Encounter (HOSPITAL_COMMUNITY): Payer: Self-pay | Admitting: Family Medicine

## 2015-11-25 ENCOUNTER — Inpatient Hospital Stay (HOSPITAL_COMMUNITY)
Admission: AD | Admit: 2015-11-25 | Discharge: 2015-11-28 | DRG: 383 | Disposition: A | Discharge status: Skilled Nursing Facility | Payer: Medicare Other | Source: Other Acute Inpatient Hospital | Attending: Internal Medicine | Admitting: Internal Medicine

## 2015-11-25 DIAGNOSIS — K921 Melena: Secondary | ICD-10-CM | POA: Diagnosis present

## 2015-11-25 DIAGNOSIS — D649 Anemia, unspecified: Secondary | ICD-10-CM

## 2015-11-25 DIAGNOSIS — K259 Gastric ulcer, unspecified as acute or chronic, without hemorrhage or perforation: Principal | ICD-10-CM | POA: Diagnosis present

## 2015-11-25 DIAGNOSIS — K7031 Alcoholic cirrhosis of liver with ascites: Secondary | ICD-10-CM | POA: Diagnosis not present

## 2015-11-25 DIAGNOSIS — K219 Gastro-esophageal reflux disease without esophagitis: Secondary | ICD-10-CM | POA: Diagnosis present

## 2015-11-25 DIAGNOSIS — Z882 Allergy status to sulfonamides status: Secondary | ICD-10-CM | POA: Diagnosis not present

## 2015-11-25 DIAGNOSIS — T1490XA Injury, unspecified, initial encounter: Secondary | ICD-10-CM

## 2015-11-25 DIAGNOSIS — F0390 Unspecified dementia without behavioral disturbance: Secondary | ICD-10-CM | POA: Diagnosis present

## 2015-11-25 DIAGNOSIS — D62 Acute posthemorrhagic anemia: Secondary | ICD-10-CM | POA: Diagnosis present

## 2015-11-25 DIAGNOSIS — Z87891 Personal history of nicotine dependence: Secondary | ICD-10-CM | POA: Diagnosis not present

## 2015-11-25 DIAGNOSIS — I10 Essential (primary) hypertension: Secondary | ICD-10-CM | POA: Insufficient documentation

## 2015-11-25 DIAGNOSIS — Z79899 Other long term (current) drug therapy: Secondary | ICD-10-CM | POA: Diagnosis not present

## 2015-11-25 DIAGNOSIS — K264 Chronic or unspecified duodenal ulcer with hemorrhage: Secondary | ICD-10-CM | POA: Diagnosis present

## 2015-11-25 DIAGNOSIS — E861 Hypovolemia: Secondary | ICD-10-CM | POA: Diagnosis present

## 2015-11-25 DIAGNOSIS — K922 Gastrointestinal hemorrhage, unspecified: Secondary | ICD-10-CM | POA: Diagnosis not present

## 2015-11-25 DIAGNOSIS — K269 Duodenal ulcer, unspecified as acute or chronic, without hemorrhage or perforation: Secondary | ICD-10-CM | POA: Diagnosis not present

## 2015-11-25 DIAGNOSIS — K625 Hemorrhage of anus and rectum: Secondary | ICD-10-CM | POA: Diagnosis present

## 2015-11-25 DIAGNOSIS — D72829 Elevated white blood cell count, unspecified: Secondary | ICD-10-CM | POA: Diagnosis present

## 2015-11-25 DIAGNOSIS — N390 Urinary tract infection, site not specified: Secondary | ICD-10-CM | POA: Diagnosis present

## 2015-11-25 DIAGNOSIS — Z880 Allergy status to penicillin: Secondary | ICD-10-CM

## 2015-11-25 DIAGNOSIS — Z7982 Long term (current) use of aspirin: Secondary | ICD-10-CM

## 2015-11-25 DIAGNOSIS — G9341 Metabolic encephalopathy: Secondary | ICD-10-CM | POA: Diagnosis present

## 2015-11-25 DIAGNOSIS — K7460 Unspecified cirrhosis of liver: Secondary | ICD-10-CM | POA: Diagnosis present

## 2015-11-25 DIAGNOSIS — W19XXXA Unspecified fall, initial encounter: Secondary | ICD-10-CM | POA: Diagnosis not present

## 2015-11-25 DIAGNOSIS — F17210 Nicotine dependence, cigarettes, uncomplicated: Secondary | ICD-10-CM | POA: Insufficient documentation

## 2015-11-25 DIAGNOSIS — R7989 Other specified abnormal findings of blood chemistry: Secondary | ICD-10-CM

## 2015-11-25 DIAGNOSIS — K7030 Alcoholic cirrhosis of liver without ascites: Secondary | ICD-10-CM | POA: Diagnosis not present

## 2015-11-25 DIAGNOSIS — K449 Diaphragmatic hernia without obstruction or gangrene: Secondary | ICD-10-CM | POA: Diagnosis present

## 2015-11-25 DIAGNOSIS — R778 Other specified abnormalities of plasma proteins: Secondary | ICD-10-CM | POA: Diagnosis present

## 2015-11-25 HISTORY — DX: Gastrointestinal hemorrhage, unspecified: K92.2

## 2015-11-25 HISTORY — DX: Pure hypercholesterolemia, unspecified: E78.00

## 2015-11-25 HISTORY — DX: Essential (primary) hypertension: I10

## 2015-11-25 LAB — BASIC METABOLIC PANEL
ANION GAP: 15 (ref 5–15)
BUN: 42 mg/dL — ABNORMAL HIGH (ref 6–20)
CHLORIDE: 111 mmol/L (ref 101–111)
CO2: 11 mmol/L — AB (ref 22–32)
Calcium: 7 mg/dL — ABNORMAL LOW (ref 8.9–10.3)
Creatinine, Ser: 0.75 mg/dL (ref 0.44–1.00)
GFR calc non Af Amer: >60 mL/min (ref >60)
GLUCOSE: 107 mg/dL — AB (ref 65–99)
POTASSIUM: 4.2 mmol/L (ref 3.5–5.1)
Sodium: 137 mmol/L (ref 135–145)

## 2015-11-25 LAB — CBC WITH DIFFERENTIAL/PLATELET
BAND NEUTROPHILS: 0 %
BASOS PCT: 0 %
BLASTS: 0 %
Basophils Absolute: 0 10*3/uL (ref 0–0.1)
Basophils Absolute: 0 10*3/uL (ref 0.0–0.1)
Basophils Relative: 0 %
EOS ABS: 0 10*3/uL (ref 0–0.7)
Eosinophils Absolute: 0 10*3/uL (ref 0.0–0.7)
Eosinophils Relative: 0 %
Eosinophils Relative: 0 %
HCT: 34.5 % — ABNORMAL LOW (ref 36.0–46.0)
HEMATOCRIT: 14.4 % — AB (ref 35.0–47.0)
HEMOGLOBIN: 4.5 g/dL — AB (ref 12.0–16.0)
Hemoglobin: 11.5 g/dL — ABNORMAL LOW (ref 12.0–15.0)
LYMPHS PCT: 10 %
Lymphocytes Relative: 17 %
Lymphs Abs: 1.5 10*3/uL (ref 1.0–3.6)
Lymphs Abs: 2.6 10*3/uL (ref 0.7–4.0)
MCH: 30.5 pg (ref 26.0–34.0)
MCH: 30.3 pg (ref 26.0–34.0)
MCHC: 31.2 g/dL — AB (ref 32.0–36.0)
MCHC: 33.3 g/dL (ref 30.0–36.0)
MCV: 97.5 fL (ref 80.0–100.0)
MCV: 91 fL (ref 78.0–100.0)
MONO ABS: 0.3 10*3/uL (ref 0.2–0.9)
MONOS PCT: 2 %
Metamyelocytes Relative: 0 %
Monocytes Absolute: 1.5 10*3/uL — ABNORMAL HIGH (ref 0.1–1.0)
Monocytes Relative: 10 %
Myelocytes: 0 %
NEUTROS ABS: 13.4 10*3/uL — AB (ref 1.4–6.5)
NEUTROS PCT: 88 %
NRBC: 0 /100{WBCs}
Neutro Abs: 11 10*3/uL — ABNORMAL HIGH (ref 1.7–7.7)
Neutrophils Relative %: 73 %
OTHER: 0 %
PLATELETS: 211 10*3/uL (ref 150–440)
Platelets: 131 10*3/uL — ABNORMAL LOW (ref 150–400)
Promyelocytes Absolute: 0 %
RBC: 1.47 MIL/uL — ABNORMAL LOW (ref 3.80–5.20)
RBC: 3.79 MIL/uL — ABNORMAL LOW (ref 3.87–5.11)
RDW: 15.7 % — AB (ref 11.5–14.5)
RDW: 15.6 % — ABNORMAL HIGH (ref 11.5–15.5)
WBC: 15.2 10*3/uL — ABNORMAL HIGH (ref 3.6–11.0)
WBC: 15 10*3/uL — ABNORMAL HIGH (ref 4.0–10.5)

## 2015-11-25 LAB — PROTIME-INR
INR: 1.7
INR: 1.36
Prothrombin Time: 20.2 seconds — ABNORMAL HIGH (ref 11.4–15.2)
Prothrombin Time: 16.8 seconds — ABNORMAL HIGH (ref 11.4–15.2)

## 2015-11-25 LAB — HEPATIC FUNCTION PANEL
ALBUMIN: 2.1 g/dL — AB (ref 3.5–5.0)
ALK PHOS: 91 U/L (ref 38–126)
ALT: 17 U/L (ref 14–54)
AST: 30 U/L (ref 15–41)
BILIRUBIN TOTAL: 2.1 mg/dL — AB (ref 0.3–1.2)
Bilirubin, Direct: 0.4 mg/dL (ref 0.1–0.5)
Indirect Bilirubin: 1.7 mg/dL — ABNORMAL HIGH (ref 0.3–0.9)
Total Protein: 4.8 g/dL — ABNORMAL LOW (ref 6.5–8.1)

## 2015-11-25 LAB — PREPARE RBC (CROSSMATCH)

## 2015-11-25 LAB — BLOOD GAS, ARTERIAL
Acid-base deficit: 6.4 mmol/L — ABNORMAL HIGH (ref 0.0–2.0)
Allens test (pass/fail): POSITIVE — AB
BICARBONATE: 16.4 meq/L — AB (ref 21.0–28.0)
FIO2: 0.21
O2 SAT: 95.3 %
PATIENT TEMPERATURE: 37
PO2 ART: 73 mmHg — AB (ref 83.0–108.0)
pCO2 arterial: 23 mmHg — ABNORMAL LOW (ref 32.0–48.0)
pH, Arterial: 7.46 — ABNORMAL HIGH (ref 7.350–7.450)

## 2015-11-25 LAB — TSH: TSH: 1.455 u[IU]/mL (ref 0.350–4.500)

## 2015-11-25 LAB — ABO/RH
ABO/RH(D): O POS
ABO/RH(D): O POS

## 2015-11-25 LAB — TROPONIN I
TROPONIN I: 0.07 ng/mL — AB (ref <0.03)
Troponin I: 0.11 ng/mL (ref <0.03)

## 2015-11-25 LAB — APTT: APTT: 37 s — AB (ref 24–36)

## 2015-11-25 MED ORDER — SODIUM CHLORIDE 0.9 % IV SOLN
Freq: Once | INTRAVENOUS | Status: AC
  Administered 2015-11-25: 21:00:00 via INTRAVENOUS

## 2015-11-25 MED ORDER — SODIUM CHLORIDE 0.9 % IV SOLN
10.0000 mL/h | Freq: Once | INTRAVENOUS | Status: AC
  Administered 2015-11-25: 100 mL/h via INTRAVENOUS

## 2015-11-25 MED ORDER — SODIUM CHLORIDE 0.9% FLUSH
3.0000 mL | Freq: Two times a day (BID) | INTRAVENOUS | Status: DC
  Administered 2015-11-25: 3 mL via INTRAVENOUS

## 2015-11-25 MED ORDER — ACETAMINOPHEN 650 MG RE SUPP: 650.0000 mg | Freq: Four times a day (QID) | RECTAL | Status: DC | PRN

## 2015-11-25 MED ORDER — HYDRALAZINE HCL 20 MG/ML IJ SOLN: 5.0000 mg | INTRAMUSCULAR | Status: DC | PRN

## 2015-11-25 MED ORDER — SODIUM CHLORIDE 0.9 % IV SOLN
8.0000 mg/h | INTRAVENOUS | Status: DC
  Administered 2015-11-25 – 2015-11-26 (×2): 8 mg/h via INTRAVENOUS
  Filled 2015-11-25 (×6): qty 80

## 2015-11-25 MED ORDER — SODIUM CHLORIDE 0.9 % IV SOLN: 250.0000 mL | INTRAVENOUS | Status: DC | PRN

## 2015-11-25 MED ORDER — PANTOPRAZOLE SODIUM 40 MG IV SOLR
40.0000 mg | Freq: Once | INTRAVENOUS | Status: AC
  Administered 2015-11-25: 40 mg via INTRAVENOUS
  Filled 2015-11-25: qty 40

## 2015-11-25 MED ORDER — ONDANSETRON HCL 4 MG/2ML IJ SOLN: 4.0000 mg | Freq: Four times a day (QID) | INTRAMUSCULAR | Status: DC | PRN

## 2015-11-25 MED ORDER — SODIUM CHLORIDE 0.9 % IV SOLN
10.0000 mL/h | Freq: Once | INTRAVENOUS | Status: DC
Start: 2015-11-25 — End: 2015-11-25

## 2015-11-25 MED ORDER — SODIUM CHLORIDE 0.9% FLUSH: 3.0000 mL | INTRAVENOUS | Status: DC | PRN

## 2015-11-25 MED ORDER — ONDANSETRON HCL 4 MG PO TABS: 4.0000 mg | ORAL_TABLET | Freq: Four times a day (QID) | ORAL | Status: DC | PRN

## 2015-11-25 MED ORDER — ATORVASTATIN CALCIUM 40 MG PO TABS
40.0000 mg | ORAL_TABLET | Freq: Every day | ORAL | Status: DC
  Administered 2015-11-26 – 2015-11-28 (×3): 40 mg via ORAL
  Filled 2015-11-25 (×3): qty 1

## 2015-11-25 MED ORDER — SODIUM CHLORIDE 0.9 % IV SOLN
1.0000 g | Freq: Once | INTRAVENOUS | Status: AC
  Administered 2015-11-25: 1 g via INTRAVENOUS
  Filled 2015-11-25: qty 10

## 2015-11-25 MED ORDER — PANTOPRAZOLE SODIUM 40 MG IV SOLR: 40.0000 mg | Freq: Two times a day (BID) | INTRAVENOUS | Status: DC

## 2015-11-25 MED ORDER — ACETAMINOPHEN 325 MG PO TABS: 650.0000 mg | ORAL_TABLET | Freq: Four times a day (QID) | ORAL | Status: DC | PRN

## 2015-11-25 NOTE — ED Notes (Signed)
Pt gave verbal consent to transfuse emergent blood to Dr. Scotty CourtStafford and this nurse.

## 2015-11-25 NOTE — Progress Notes (Signed)
CRITICAL VALUE ALERT  Critical value received:  Troponin 0.11  Date of notification:  11/25/15  Time of notification:  2216  Critical value read back:Yes.    Nurse who received alert:  Balinda QuailsJessica Miller, RN  MD notified (1st page):  Donnamarie PoagK. Kirby  Time of first page: 2219

## 2015-11-25 NOTE — Progress Notes (Signed)
PAGED [facility] ADMISSIONS PAGER TO NOTIFY OF PT ARRIVAL SINCE HAVENT HEARD FROM DR. MERRELL YET.

## 2015-11-25 NOTE — H&P (Signed)
History and Physical    Joann Huang ZOX:[id number] DOB: January 22, 1931 DOA: 11/25/2015  PCP: No PCP Per Patient   Patient coming from: [place]   Chief Complaint: Weakness, fall, melena   HPI: Joann Huang is a 82 y.o. female with medical history significant for hypertension and GERD who presents in transfer from [facility] for evaluation of generalized weakness with lightheadedness on standing and melena. Patient had reportedly been in her usual state until the development of generalized weakness over the past couple days as well as lightheadedness upon standing which has resulted in a fall. There was no head strike reported with the fall, no loss of consciousness, and the patient is not on anticoagulation. She had right flank pain following the fall and there is a large ecchymosis there. Today, the patient was noted to have 2 large black tarry stools and was transported to [facility] for further evaluation of this. She was reportedly hypotensive on the scene with EMS and 1.5 L of normal saline was bolused en route to the hospital. She uses NSAID when necessary and takes a daily Prilosec.  [place] ED Course: Upon arrival to the [place] ED, patient was found to be afebrile, saturating well on room air, and with vitals otherwise stable. Chemistry panel demonstrates a BUN of 42 with serum creatinine of 0.75 as well as hypocalcemia to 7.0. CBC is notable for a chronic leukocytosis to 15,200 and a new normocytic anemia with hemoglobin of 4.5, down from 13.3 on 11/03/2015. INR was 1.70 and troponin was mildly elevated to 0.07. Type and screen was performed, patient was placed on Protonix infusion, and 4 units of packed red blood cells were ordered for immediate transfusion. Patient remained hemodynamically stable in the emergency department with no sign of active bleeding. She was accepted and transferred to [facility] stepdown unit where she has been admitted for ongoing evaluation  and management of suspected acute upper GI bleed with critical anemia. The case has been discussed with Dr. Lavon Paganini of  [facility] who has very kindly offered her recommendations and will plan to have the patient seen in the morning for possible EGD.  Review of Systems:  Unable to obtain ROS secondary to patient's clinical condition with disorientation and suspected dementia.  Past Medical History:  Diagnosis Date  . Acute upper GI bleed 11/25/2015  . High cholesterol   . History of alcohol abuse    quit > 25years ago  . HTN (hypertension)    Not on any medications  . Hypertension   . Tobacco use     Past Surgical History:  Procedure Laterality Date  . APPENDECTOMY    . CHOLECYSTECTOMY    . KNEE ARTHROSCOPY Left   . TONSILLECTOMY       reports that she quit smoking about 4 months ago. Her smoking use included Cigarettes. She has a 65.00 pack-year smoking history. She has never used smokeless tobacco. She reports that she drinks alcohol. She reports that she does not use drugs.  Allergies  Allergen Reactions  . Sulfa Antibiotics Hives and Swelling  . Penicillins Hives and Other (See Comments)    Has patient had a PCN reaction causing immediate rash, facial/tongue/throat swelling, SOB or lightheadedness with hypotension: yes Has patient had a PCN reaction causing severe rash involving mucus membranes or skin necrosis: No Has patient had a PCN reaction that required hospitalization No Has patient had a PCN reaction occurring within the last 10 years: No If all of the above  answers are "NO", then may proceed with Cephalosporin use.    Family History  Problem Relation Age of Onset  . CAD Mother      Prior to Admission medications   Medication Sig Start Date End Date Taking? Authorizing Provider  acetaminophen (TYLENOL) 650 MG CR tablet Take 1,300 mg by mouth daily.   Yes Historical Provider, MD  amLODipine (NORVASC) 5 MG tablet Take 1 tablet (5 mg total) by mouth daily.  11/03/15  Yes Altamese DillingVaibhavkumar Vachhani, MD  aspirin EC 81 MG EC tablet Take 1 tablet (81 mg total) by mouth daily. 11/03/15  Yes Altamese DillingVaibhavkumar Vachhani, MD  atorvastatin (LIPITOR) 40 MG tablet Take 1 tablet (40 mg total) by mouth daily at 6 PM. 11/03/15  Yes Altamese DillingVaibhavkumar Vachhani, MD  metoprolol succinate (TOPROL-XL) 50 MG 24 hr tablet Take 1 tablet (50 mg total) by mouth daily. Take with or immediately following a meal. 11/03/15  Yes Altamese DillingVaibhavkumar Vachhani, MD  omeprazole (PRILOSEC) 20 MG capsule Take 20 mg by mouth daily.   Yes Historical Provider, MD    Physical Exam: Vitals:   11/25/15 1830 11/25/15 2012  BP: (!) 142/94 (!) 118/96  Pulse: 82 80  Resp: (!) 24 20  Temp: 97.7 F (36.5 C) 98.5 F (36.9 C)  TempSrc: Oral Oral  SpO2: 98% 98%      Constitutional: NAD, calm, comfortable, pale Eyes: PERTLA, lids and conjunctivae normal ENMT: Mucous membranes are moist. Posterior pharynx clear of any exudate or lesions.   Neck: normal, supple, no masses, no thyromegaly Respiratory: clear to auscultation bilaterally, no wheezing, no crackles. No accessory muscle use.  Cardiovascular: S1 & S2 heard, regular rate and rhythm, soft systolic murmur at apex. No extremity edema. No significant JVD. Abdomen: No distension, mild tenderness in right quadrants, large ecchymosis over right flank, no masses palpated. Bowel sounds normal.  Musculoskeletal: no clubbing / cyanosis. No joint deformity upper and lower extremities.    Skin: no significant rashes, lesions, ulcers. Warm, dry, well-perfused. Neurologic: CN 2-12 grossly intact. Sensation intact, DTR normal. Strength 5/5 in all 4 limbs.  Psychiatric: Normal judgment and insight. Alert and oriented to person and place, but not month or year. Normal mood and affect.     Labs on Admission: I have personally reviewed following labs and imaging studies  CBC:  Recent Labs Lab 11/25/15 1346  WBC 15.2*  NEUTROABS 13.4*  HGB 4.5*  HCT 14.4*  MCV 97.5    PLT 211   Basic Metabolic Panel:  Recent Labs Lab 11/25/15 1346  NA 137  K 4.2  CL 111  CO2 11*  GLUCOSE 107*  BUN 42*  CREATININE 0.75  CALCIUM 7.0*   GFR: Estimated Creatinine Clearance: 55.4 mL/min (by C-G formula based on SCr of 0.8 mg/dL). Liver Function Tests: No results for input(s): AST, ALT, ALKPHOS, BILITOT, PROT, ALBUMIN in the last 168 hours. No results for input(s): LIPASE, AMYLASE in the last 168 hours. No results for input(s): AMMONIA in the last 168 hours. Coagulation Profile:  Recent Labs Lab 11/25/15 1346  INR 1.70   Cardiac Enzymes:  Recent Labs Lab 11/25/15 1346  TROPONINI 0.07*   BNP (last 3 results) No results for input(s): PROBNP in the last 8760 hours. HbA1C: No results for input(s): HGBA1C in the last 72 hours. CBG: No results for input(s): GLUCAP in the last 168 hours. Lipid Profile: No results for input(s): CHOL, HDL, LDLCALC, TRIG, CHOLHDL, LDLDIRECT in the last 72 hours. Thyroid Function Tests: No results for input(s): TSH,  T4TOTAL, FREET4, T3FREE, THYROIDAB in the last 72 hours. Anemia Panel: No results for input(s): VITAMINB12, FOLATE, FERRITIN, TIBC, IRON, RETICCTPCT in the last 72 hours. Urine analysis:    Component Value Date/Time   COLORURINE AMBER (A) 10/31/2015 1819   APPEARANCEUR HAZY (A) 10/31/2015 1819   LABSPEC 1.026 10/31/2015 1819   PHURINE 5.0 10/31/2015 1819   GLUCOSEU NEGATIVE 10/31/2015 1819   HGBUR 2+ (A) 10/31/2015 1819   BILIRUBINUR NEGATIVE 10/31/2015 1819   KETONESUR 1+ (A) 10/31/2015 1819   PROTEINUR 100 (A) 10/31/2015 1819   NITRITE NEGATIVE 10/31/2015 1819   LEUKOCYTESUR 2+ (A) 10/31/2015 1819   Sepsis Labs: @LABRCNTIP (procalcitonin:4,lacticidven:4) )No results found for this or any previous visit (from the past 240 hour(s)).   Radiological Exams on Admission: No results found.  EKG: Ordered and pending.   Assessment/Plan  1. Acute upper GI bleed with symptomatic anemia  - ~2 days gen  weakness and orthostasis with fall, then 2 lg black tarry stools day of admit  - Had hypotension on EMS arrival and was given 1.5 L NS en route  - Hgb 4.5 on arrival to outside hospital, had been 13.3 on 11/03/15  - transfused 4 units PTA, check STAT CBC now  - Protonix infusion  - Serial H&H  - NPO for ?EGD in am; [facility] consulting and much appreciated   - Monitor in SD  2. Elevated troponin  - Likely demand in setting of acute GIB  - STAT EKG, trend trop  - Monitor on telemetry for ischemic change  - No asa d/t bleed    3. GERD - Prilosec at home  - Protonix gtt now in setting of suspected acute UGIB  - No EGD on file   4. Hypertension  - Hold home Norvasc and Toprol given acute bleed and hypotension PTA  - Resume as appropriate   5. Falls   - Orthostasis secondary to hypovolemia/bleed - CT abd/pel given flank ecchymosis and anemia, r/o hematoma or hemoperioneum - Up with assistance only     DVT prophylaxis: SCD's  Code Status: Full  Family Communication: Discussed with patient Disposition Plan: Admit to stepdown   Consults called: [facility] Admission status: Inpatient    Briscoe Deutscherimothy S Jarad Barth, MD [facility] Pager [phone]  If 7PM-7AM, please contact night-coverage [email or web address] Password [email or web address]  11/25/2015, 8:23 PM

## 2015-11-25 NOTE — Progress Notes (Signed)
[facility] arrives with pt to room 3s11 at this time.  Pt has no c/o pain or s/s of any acute distress.  Oriented to room/unit.  Call bell in reach.

## 2015-11-25 NOTE — ED Provider Notes (Signed)
[facility] Emergency Department Provider Note  ____________________________________________  Time seen: Approximately 1:43 PM  I have reviewed the triage vital signs and the nursing notes.   HISTORY  Chief Complaint Rectal Bleeding  Level 5 caveat:  Portions of the history and physical were unable to be obtained due to the patient's acute illness   HPI Joann Huang is a 82 y.o. female presents with rectal bleeding. Report from rehabilitation facility where she is currently being treated is a hemoglobin of 6.1. Patient is also had 2 falls recently. She states that she feels lightheaded when she stands up. Also generalized weakness. No chest pain or shortness of breath. Has some generalized bowel pain and some nausea but no vomiting. Never had any history of GI bleed. Takes ibuprofen intermittently but no aspirin, no blood thinners.     Past Medical History:  Diagnosis Date  . History of alcohol abuse    quit > 25years ago  . HTN (hypertension)    Not on any medications  . Tobacco use      Patient Active Problem List   Diagnosis Date Noted  . Weakness of both lower extremities 11/01/2015  . UTI (urinary tract infection) - with encephalopathy 11/01/2015  . Essential hypertension 11/01/2015  . Metabolic encephalopathy - related to UTI 11/01/2015  . NSTEMI (non-ST elevated myocardial infarction) (HCC) 10/31/2015     Past Surgical History:  Procedure Laterality Date  . APPENDECTOMY       Prior to Admission medications   Medication Sig Start Date End Date Taking? Authorizing Provider  acetaminophen (TYLENOL) 650 MG CR tablet Take 1,300 mg by mouth daily.    Historical Provider, MD  amLODipine (NORVASC) 5 MG tablet Take 1 tablet (5 mg total) by mouth daily. 11/03/15   Altamese DillingVaibhavkumar Vachhani, MD  aspirin EC 81 MG EC tablet Take 1 tablet (81 mg total) by mouth daily. 11/03/15   Altamese DillingVaibhavkumar Vachhani, MD  atorvastatin (LIPITOR) 40 MG tablet Take 1  tablet (40 mg total) by mouth daily at 6 PM. 11/03/15   Altamese DillingVaibhavkumar Vachhani, MD  metoprolol succinate (TOPROL-XL) 50 MG 24 hr tablet Take 1 tablet (50 mg total) by mouth daily. Take with or immediately following a meal. 11/03/15   Altamese DillingVaibhavkumar Vachhani, MD     Allergies Sulfa antibiotics and Penicillins   Family History  Problem Relation Age of Onset  . CAD Mother     Social History Social History  Substance Use Topics  . Smoking status: Current Every Day Smoker    Packs/day: 1.00    Types: Cigarettes  . Smokeless tobacco: Not on file  . Alcohol use No     Comment: h/o heavy alcohol abuse, wuit 25years ago    Review of Systems  Constitutional:   No fever or chills.   Cardiovascular:   No chest pain. Respiratory:   No dyspnea or cough. Gastrointestinal:  Positive black bloody stool.   10-point ROS otherwise negative.  ____________________________________________   PHYSICAL EXAM:  VITAL SIGNS: ED Triage Vitals  Enc Vitals Group     BP      Pulse      Resp      Temp      Temp src      SpO2      Weight      Height      Head Circumference      Peak Flow      Pain Score      Pain Loc  Pain Edu?      Excl. in GC?   Heart rate 80 Blood pressure 135/70 Oxygen saturation unable to read Respiratory rate 22  Vital signs reviewed, nursing assessments reviewed.   Constitutional:   Alert and oriented. Ill-appearing, pale. Eyes:   No scleral icterus. Significant conjunctival pallor. Extraocular movements intact. ENT   Head:   Normocephalic and atraumatic.   Nose:   No congestion/rhinnorhea. No septal hematoma. No epistaxis   Mouth/Throat:   MMM, no pharyngeal erythema. No peritonsillar mass.    Neck:   No stridor. No SubQ emphysema. No meningismus. Hematological/Lymphatic/Immunilogical:   No cervical lymphadenopathy. Cardiovascular:   RRR. Symmetric bilateral radial and DP pulses.  No murmurs.  Respiratory:   Normal respiratory effort without  tachypnea nor retractions. Breath sounds are clear and equal bilaterally. No wheezes/rales/rhonchi. Gastrointestinal:   Soft and nontender. Non distended. There is no CVA tenderness.  No rebound, rigidity, or guarding. Rectal exam reveals melanotic stool, strongly Hemoccult-positive Genitourinary:   deferred Musculoskeletal:   Nontender with normal range of motion in all extremities. No joint effusions.  No lower extremity tenderness.  No edema. Tissues informants are soft. No tense hematoma. Neurologic:   Normal speech and language.  CN 2-10 normal. Motor grossly intact. No gross focal neurologic deficits are appreciated.  Skin:    Ecchymosis on right abdomen and left inner thigh..  ____________________________________________    LABS (pertinent positives/negatives) (all labs ordered are listed, but only abnormal results are displayed) Labs Reviewed  BASIC METABOLIC PANEL  CBC WITH DIFFERENTIAL/PLATELET  PROTIME-INR  APTT  PREPARE RBC (CROSSMATCH)   ____________________________________________   EKG    ____________________________________________    RADIOLOGY    ____________________________________________   PROCEDURES Procedures CRITICAL CARE Performed by: Scotty CourtSTAFFORD, Shanina Kepple   Total critical care time: 35 minutes  Critical care time was exclusive of separately billable procedures and treating other patients.  Critical care was necessary to treat or prevent imminent or life-threatening deterioration.  Critical care was time spent personally by me on the following activities: development of treatment plan with patient and/or surrogate as well as nursing, discussions with consultants, evaluation of patient's response to treatment, examination of patient, obtaining history from patient or surrogate, ordering and performing treatments and interventions, ordering and review of laboratory studies, ordering and review of radiographic studies, pulse oximetry and  re-evaluation of patient's condition.  ____________________________________________   INITIAL IMPRESSION / ASSESSMENT AND PLAN / ED COURSE  Pertinent labs & imaging results that were available during my care of the patient were reviewed by me and considered in my medical decision making (see chart for details).  Patient presents with GI bleed, reported hemoglobin of 6. Blood pressure is intact but patient is already severely anemic and rapidly losing blood. We'll start emergent release blood transfusion. Patient verbally consents. We had no GI coverage at this hospital today so I[facility]. [facility] Transfer Center has been contacted. We'll continue to monitor while transfusing. Obtain labs again in the ED.     Clinical Course  Value Comment By Time   Blood transfusion 2 units complete.  Still breathing comfortably.  Unable to obtain SO2, will perform ABG.   Sharman CheekPhillip Kevin Mario, MD 08/15 1415  Hemoglobin: (!!) 4.5 Hemoglobin 4.5. We'll transfuse another 2 units. Patient reassessed, feels better after the first transfusion. Lungs clear to auscultation bilaterally. Vital signs unremarkable. Oxygen saturation 100% with pulse oximeter. Sharman CheekPhillip Fabyan Loughmiller, MD 08/15 1436   Starting second transfusion  of units 3 and 4.  Still no callback from [place] transfer center.  Sharman Cheek, MD 08/15 1503    ----------------------------------------- 3:20 PM on 11/25/2015 -----------------------------------------  Discussed with [facility] hospitalist Dr. Konrad Dolores who accepts for transfer to the [facility] stepdown unit. Requests add on troponin. Clear liquid diet for now, nothing by mouth after midnight. Patient is stabilized for transport.  Patient signed out to Dr. Don Perking for ongoing monitoring in the emergency department while awaiting transport to [facility]. ____________________________________________   FINAL CLINICAL IMPRESSION(S) / ED  DIAGNOSES  Final diagnoses:  Acute GI bleeding  Symptomatic anemia       Portions of this note were generated with dragon dictation software. Dictation errors may occur despite best attempts at proofreading.    Sharman Cheek, MD 11/25/15 [phone]

## 2015-11-25 NOTE — Progress Notes (Signed)
PAGED ATTENDING, DR. Konrad DoloresMERRELL, AT THIS TIME TO NOTIFY OF PT ARRIVAL.

## 2015-11-25 NOTE — ED Triage Notes (Signed)
Pt to ED via EMS from [facility] c/o rectal bleeding starting today.  Per EMS patient fell several days ago and receiving rehab [facility].  Staff stated patient had 2 large bowel movements today with dark tarry stool.  Pt presents with bruising to RLQ and inner left leg, abd distended and rigid, nontender.  Pt received 1500mL saline bolus from EMS for hypotension and weak radial pulse.  Pt A&Ox4, speaking in complete and coherent sentences, pale.

## 2015-11-26 ENCOUNTER — Inpatient Hospital Stay (HOSPITAL_COMMUNITY): Payer: Medicare Other

## 2015-11-26 ENCOUNTER — Encounter (HOSPITAL_COMMUNITY): Payer: Self-pay

## 2015-11-26 ENCOUNTER — Encounter (HOSPITAL_COMMUNITY)
Admission: AD | Disposition: A | Discharge status: Skilled Nursing Facility | Payer: Self-pay | Source: Other Acute Inpatient Hospital | Attending: Internal Medicine

## 2015-11-26 DIAGNOSIS — K922 Gastrointestinal hemorrhage, unspecified: Secondary | ICD-10-CM

## 2015-11-26 DIAGNOSIS — K7030 Alcoholic cirrhosis of liver without ascites: Secondary | ICD-10-CM

## 2015-11-26 DIAGNOSIS — D72829 Elevated white blood cell count, unspecified: Secondary | ICD-10-CM

## 2015-11-26 DIAGNOSIS — K219 Gastro-esophageal reflux disease without esophagitis: Secondary | ICD-10-CM

## 2015-11-26 DIAGNOSIS — R7989 Other specified abnormal findings of blood chemistry: Secondary | ICD-10-CM

## 2015-11-26 DIAGNOSIS — W19XXXA Unspecified fall, initial encounter: Secondary | ICD-10-CM | POA: Diagnosis present

## 2015-11-26 DIAGNOSIS — K7460 Unspecified cirrhosis of liver: Secondary | ICD-10-CM | POA: Diagnosis present

## 2015-11-26 DIAGNOSIS — I10 Essential (primary) hypertension: Secondary | ICD-10-CM

## 2015-11-26 DIAGNOSIS — D62 Acute posthemorrhagic anemia: Secondary | ICD-10-CM | POA: Diagnosis present

## 2015-11-26 DIAGNOSIS — D649 Anemia, unspecified: Secondary | ICD-10-CM

## 2015-11-26 DIAGNOSIS — K921 Melena: Secondary | ICD-10-CM

## 2015-11-26 HISTORY — PX: ESOPHAGOGASTRODUODENOSCOPY: SHX5428

## 2015-11-26 LAB — TYPE AND SCREEN
ABO/RH(D): O POS
Antibody Screen: NEGATIVE
UNIT DIVISION: 0
UNIT DIVISION: 0
Unit division: 0
Unit division: 0

## 2015-11-26 LAB — BASIC METABOLIC PANEL
Anion gap: 7 (ref 5–15)
BUN: 36 mg/dL — AB (ref 6–20)
CHLORIDE: 110 mmol/L (ref 101–111)
CO2: 22 mmol/L (ref 22–32)
Calcium: 7.9 mg/dL — ABNORMAL LOW (ref 8.9–10.3)
Creatinine, Ser: 0.71 mg/dL (ref 0.44–1.00)
GFR calc Af Amer: >60 mL/min (ref >60)
GFR calc non Af Amer: >60 mL/min (ref >60)
GLUCOSE: 76 mg/dL (ref 65–99)
POTASSIUM: 3.5 mmol/L (ref 3.5–5.1)
Sodium: 139 mmol/L (ref 135–145)

## 2015-11-26 LAB — CBC
HCT: 34.9 % — ABNORMAL LOW (ref 36.0–46.0)
Hemoglobin: 11.5 g/dL — ABNORMAL LOW (ref 12.0–15.0)
MCH: 30.3 pg (ref 26.0–34.0)
MCHC: 33 g/dL (ref 30.0–36.0)
MCV: 91.8 fL (ref 78.0–100.0)
PLATELETS: 150 10*3/uL (ref 150–400)
RBC: 3.8 MIL/uL — AB (ref 3.87–5.11)
RDW: 16.7 % — ABNORMAL HIGH (ref 11.5–15.5)
WBC: 12.3 10*3/uL — AB (ref 4.0–10.5)

## 2015-11-26 LAB — GLUCOSE, CAPILLARY: Glucose-Capillary: 79 mg/dL (ref 65–99)

## 2015-11-26 LAB — TROPONIN I
Troponin I: 0.1 ng/mL (ref <0.03)
Troponin I: 0.12 ng/mL (ref <0.03)

## 2015-11-26 LAB — MRSA PCR SCREENING: MRSA by PCR: NEGATIVE

## 2015-11-26 LAB — AMMONIA: Ammonia: 25 umol/L (ref 9–35)

## 2015-11-26 SURGERY — EGD (ESOPHAGOGASTRODUODENOSCOPY)
Anesthesia: Moderate Sedation

## 2015-11-26 MED ORDER — OCTREOTIDE LOAD VIA INFUSION
50.0000 ug | Freq: Once | INTRAVENOUS | Status: AC
  Administered 2015-11-26: 50 ug via INTRAVENOUS
  Filled 2015-11-26: qty 25

## 2015-11-26 MED ORDER — BUTAMBEN-TETRACAINE-BENZOCAINE 2-2-14 % EX AERO
INHALATION_SPRAY | CUTANEOUS | Status: DC | PRN
  Administered 2015-11-26: 2 via TOPICAL

## 2015-11-26 MED ORDER — MIDAZOLAM HCL 10 MG/2ML IJ SOLN
INTRAMUSCULAR | Status: DC | PRN
  Administered 2015-11-26: 1 mg via INTRAVENOUS

## 2015-11-26 MED ORDER — SODIUM CHLORIDE 0.9 % IV SOLN
50.0000 ug/h | INTRAVENOUS | Status: DC
  Administered 2015-11-26: 50 ug/h via INTRAVENOUS
  Filled 2015-11-26 (×3): qty 1

## 2015-11-26 MED ORDER — FENTANYL CITRATE (PF) 100 MCG/2ML IJ SOLN
INTRAMUSCULAR | Status: AC
  Filled 2015-11-26: qty 2

## 2015-11-26 MED ORDER — PANTOPRAZOLE SODIUM 40 MG PO TBEC
40.0000 mg | DELAYED_RELEASE_TABLET | Freq: Two times a day (BID) | ORAL | Status: DC
  Administered 2015-11-26 – 2015-11-28 (×5): 40 mg via ORAL
  Filled 2015-11-26 (×5): qty 1

## 2015-11-26 MED ORDER — MIDAZOLAM HCL 5 MG/ML IJ SOLN
INTRAMUSCULAR | Status: AC
  Filled 2015-11-26: qty 1

## 2015-11-26 MED ORDER — SODIUM CHLORIDE 0.9 % IV SOLN
INTRAVENOUS | Status: DC
  Administered 2015-11-26: 100 mL/h via INTRAVENOUS

## 2015-11-26 MED ORDER — LACTULOSE 10 GM/15ML PO SOLN
20.0000 g | Freq: Two times a day (BID) | ORAL | Status: DC
  Administered 2015-11-26 – 2015-11-28 (×3): 20 g via ORAL
  Filled 2015-11-26 (×4): qty 30

## 2015-11-26 MED ORDER — DIATRIZOATE MEGLUMINE & SODIUM 66-10 % PO SOLN
ORAL | Status: AC
  Administered 2015-11-26: 02:00:00
  Filled 2015-11-26: qty 30

## 2015-11-26 NOTE — Op Note (Signed)
[facility] Patient Name: Joann Huang Procedure Date : 11/26/2015 MRN: [id number] Attending MD: Starr LakeHenry L. Myrtie Neitheranis , MD Date of Birth: [birth year] CSN: [id number] Age: 82 Admit Type: Inpatient Procedure:                Upper GI endoscopy Indications:              Acute post hemorrhagic anemia, Melena Providers:                Sherilyn CooterHenry L. Myrtie Neitheranis, MD, Roselie AwkwardShannon Love, RN, Oletha Blendavida                            Shoffner, Technician Referring MD:              Medicines:                Midazolam 1 mg IV, Sedation Administered by an                            Endoscopy Nurse Complications:            No immediate complications. Estimated Blood Loss:     Estimated blood loss: none. Estimated blood loss                            was minimal. Procedure:                Pre-Anesthesia Assessment:                           - Prior to the procedure, a History and Physical                            was performed, and patient medications and                            allergies were reviewed. The patient's tolerance of                            previous anesthesia was also reviewed. The risks                            and benefits of the procedure and the sedation                            options and risks were discussed with the patient.                            All questions were answered, and informed consent                            was obtained. Prior Anticoagulants: The patient has                            taken no previous anticoagulant or antiplatelet  agents. ASA Grade Assessment: III - A patient with                            severe systemic disease. After reviewing the risks                            and benefits, the patient was deemed in                            satisfactory condition to undergo the procedure.                           After obtaining informed consent, the endoscope was                            passed under direct vision.  Throughout the                            procedure, the patient's blood pressure, pulse, and                            oxygen saturations were monitored continuously. The                            was introduced through the mouth, and advanced to                            the second part of duodenum. The upper GI endoscopy                            was accomplished without difficulty. The patient                            tolerated the procedure well. Scope In: Scope Out: Findings:      A medium-sized hiatal hernia was present.      The stomach was normal.      One gastric ulcer was found on the greater curvature of the gastric       antrum. The lesion was 5 mm in largest dimension. Biopsies were taken       with a cold forceps for histology.      A single 2 mm no bleeding angiodysplastic lesion was found on the       anterior wall of the gastric body.      ne large ,non-bleeding, cratered duodenal ulcer with pigmented material       was found in the duodenal bulb. It occupied the entire anterior duodenal       bulb and extended down the duodenal sweep. There was associated edema       and lumenal narrowing, but the scope was able to pass with mild       resistance. There were two additional clean-based linear ulcers in the       second portion of the duodenum.      The cardia and gastric fundus were normal on retroflexion. Impression:               -  Medium-sized hiatal hernia.                           - Normal stomach.                           - Gastric ulcer. Biopsied.                           - A single non-bleeding angiodysplastic lesion in                            the stomach.                           - One non-bleeding duodenal ulcer with pigmented                            material. Moderate Sedation:      Moderate (conscious) sedation was administered by the endoscopy nurse       and supervised by the endoscopist. The following parameters were       monitored:  oxygen saturation, heart rate, blood pressure, respiratory       rate, EKG, adequacy of pulmonary ventilation, and response to care.       Total physician intraservice time was 7 minutes. Recommendation:           - Patient has a contact number available for                            emergencies. The signs and symptoms of potential                            delayed complications were discussed with the                            patient. Return to normal activities tomorrow.                            Written discharge instructions were provided to the                            patient.                           - Low sodium diet.                           - Use Protonix (pantoprazole) 40 mg PO BID.                           - Await pathology results.                           Stop aspirin and NSAIDs                           - Medication reconciliation was performed, and a  list of the patient's discharge medications was                            provided to the patient. Procedure Code(s):        --- Professional ---                           [phone], Esophagogastroduodenoscopy, flexible,                            transoral; with biopsy, single or multiple                           G0500, Moderate sedation services provided by the                            same physician or other qualified health care                            professional performing a gastrointestinal                            endoscopic service that sedation supports,                            requiring the presence of an independent trained                            observer to assist in the monitoring of the                            patient's level of consciousness and physiological                            status; initial 15 minutes of intra-service time;                            patient age 32 years or older (additional time may                            be reported with  [phone], as appropriate) Diagnosis Code(s):        --- Professional ---                           K44.9, Diaphragmatic hernia without obstruction or                            gangrene                           K25.9, Gastric ulcer, unspecified as acute or                            chronic, without hemorrhage or perforation  K31.819, Angiodysplasia of stomach and duodenum                            without bleeding                           K26.9, Duodenal ulcer, unspecified as acute or                            chronic, without hemorrhage or perforation                           D62, Acute posthemorrhagic anemia                           K92.1, Melena (includes Hematochezia) CPT copyright 2016 [employer]. All rights reserved. The codes documented in this report are preliminary and upon coder review may  be revised to meet current compliance requirements. Karyn Brull L. Myrtie Neither, MD 11/26/2015 3:06:01 PM This report has been signed electronically. Number of Addenda: 0

## 2015-11-26 NOTE — Progress Notes (Signed)
PROGRESS NOTE  Joann Huang  IHK:[id number]:[id number] DOB: Dec 26, 1930 DOA: 11/25/2015 PCP: No PCP Per Patient Outpatient Specialists:  Subjective: Denies any new complaints this morning, no fever or chills.  Brief Narrative:  Joann Huang is a 82 y.o. female with medical history significant for hypertension and GERD who presents in transfer from [facility] for evaluation of generalized weakness with lightheadedness on standing and melena. , and the patient is not on anticoagulation. She was found to have hemoglobin of 4.5.  Assessment & Plan:   Principal Problem:   Acute upper GI bleed Active Problems:   Essential hypertension   Symptomatic anemia   Leukocytosis   Elevated troponin I level   GERD (gastroesophageal reflux disease)   Fall    Acute upper GI bleed with symptomatic anemia  - ~2 days gen weakness and orthostasis with fall, then 2 lg black tarry stools day of admit  - Had hypotension on EMS arrival and was given 1.5 L NS en route  - Hgb 4.5 on arrival to outside hospital, had been 13.3 on 11/03/15  - Per CT scan small varices versus duodenal ulcer bleed. - Protonix infusion, GI added octreotide antibiotic. Continue NPO, possible EGD later today.  Acute blood loss anemia -Presented with hemoglobin of 4.5, baseline of 13.3. Status post transfusion of 4 units of packed RBCs. -Hemoglobin improved to 11.5  Cirrhosis -This is radiological findings based on CT scan showed shrunken and nodular liver. -Findings of small varices per CT scan. -INR is 1.7 on admission, check ammonia level.  Elevated troponin  - Likely demand in setting of acute GIB  - Has flat trend, this is likely secondary to severe anemia. -Aspirin is contraindicated because of concurrent bleeding.  GERD - Prilosec at home, CT scan showed duodenal thickening can represent duodenal ulcer.  - Protonix gtt now in setting of suspected acute UGIB  - No EGD on file   Hypertension  - Hold home Norvasc  and Toprol given acute bleed and hypotension PTA  - Resume as appropriate   Falls   - Orthostasis secondary to hypovolemia/bleed - CT abd/pel given flank ecchymosis and anemia, no intra-abdominal or retroperitoneal bleeding.    DVT prophylaxis: SCDs Code Status: Full Code Family Communication: No family members at bedside. Disposition Plan:  Diet: Diet NPO time specified Except for: Ice Chips  Consultants:   Gastroenterology  Procedures:   None  Antimicrobials:   None   Objective: Vitals:   11/26/15 0051 11/26/15 0400 11/26/15 0431 11/26/15 0746  BP: 129/72 132/65    Pulse: 86     Resp: (!) 21 20    Temp: 98.4 F (36.9 C) 98 F (36.7 C)  97.6 F (36.4 C)  TempSrc: Axillary Oral  Oral  SpO2: 95%     Weight:   74.7 kg (164 lb 10.9 oz)   Height:   5\' 6"  (1.676 m)     Intake/Output Summary (Last 24 hours) at 11/26/15 1013 Last data filed at 11/26/15 0500  Gross per 24 hour  Intake           878.75 ml  Output                0 ml  Net           878.75 ml   Filed Weights   11/26/15 0431  Weight: 74.7 kg (164 lb 10.9 oz)    Examination: General exam: Appears calm and comfortable  Respiratory system: Clear to auscultation. Respiratory effort  normal. Cardiovascular system: S1 & S2 heard, RRR. No JVD, murmurs, rubs, gallops or clicks. No pedal edema. Gastrointestinal system: Abdomen is nondistended, soft and nontender. No organomegaly or masses felt. Normal bowel sounds heard. Central nervous system: Alert and oriented. No focal neurological deficits. Extremities: Symmetric 5 x 5 power. Skin: No rashes, lesions or ulcers Psychiatry: Judgement and insight appear normal. Mood & affect appropriate.   Data Reviewed: I have personally reviewed following labs and imaging studies  CBC:  Recent Labs Lab 11/25/15 1346 11/25/15 2104  WBC 15.2* 15.0*  NEUTROABS 13.4* 11.0*  HGB 4.5* 11.5*  HCT 14.4* 34.5*  MCV 97.5 91.0  PLT 211 131*   Basic Metabolic  Panel:  Recent Labs Lab 11/25/15 1346 11/26/15 0800  NA 137 139  K 4.2 3.5  CL 111 110  CO2 11* 22  GLUCOSE 107* 76  BUN 42* 36*  CREATININE 0.75 0.71  CALCIUM 7.0* 7.9*   GFR: Estimated Creatinine Clearance: 55.1 mL/min (by C-G formula based on SCr of 0.8 mg/dL). Liver Function Tests:  Recent Labs Lab 11/25/15 2104  AST 30  ALT 17  ALKPHOS 91  BILITOT 2.1*  PROT 4.8*  ALBUMIN 2.1*   No results for input(s): LIPASE, AMYLASE in the last 168 hours. No results for input(s): AMMONIA in the last 168 hours. Coagulation Profile:  Recent Labs Lab 11/25/15 1346 11/25/15 2104  INR 1.70 1.36   Cardiac Enzymes:  Recent Labs Lab 11/25/15 1346 11/25/15 2104 11/26/15 0246 11/26/15 0800  TROPONINI 0.07* 0.11* 0.10* 0.12*   BNP (last 3 results) No results for input(s): PROBNP in the last 8760 hours. HbA1C: No results for input(s): HGBA1C in the last 72 hours. CBG:  Recent Labs Lab 11/26/15 0822  GLUCAP 79   Lipid Profile: No results for input(s): CHOL, HDL, LDLCALC, TRIG, CHOLHDL, LDLDIRECT in the last 72 hours. Thyroid Function Tests:  Recent Labs  11/25/15 2105  TSH 1.455   Anemia Panel: No results for input(s): VITAMINB12, FOLATE, FERRITIN, TIBC, IRON, RETICCTPCT in the last 72 hours. Urine analysis:    Component Value Date/Time   COLORURINE AMBER (A) 10/31/2015 1819   APPEARANCEUR HAZY (A) 10/31/2015 1819   LABSPEC 1.026 10/31/2015 1819   PHURINE 5.0 10/31/2015 1819   GLUCOSEU NEGATIVE 10/31/2015 1819   HGBUR 2+ (A) 10/31/2015 1819   BILIRUBINUR NEGATIVE 10/31/2015 1819   KETONESUR 1+ (A) 10/31/2015 1819   PROTEINUR 100 (A) 10/31/2015 1819   NITRITE NEGATIVE 10/31/2015 1819   LEUKOCYTESUR 2+ (A) 10/31/2015 1819   Sepsis Labs: @LABRCNTIP (procalcitonin:4,lacticidven:4)  ) Recent Results (from the past 240 hour(s))  MRSA PCR Screening     Status: None   Collection Time: 11/26/15  1:00 AM  Result Value Ref Range Status   MRSA by PCR  NEGATIVE NEGATIVE Final    Comment:        The GeneXpert MRSA Assay (FDA approved for NASAL specimens only), is one component of a comprehensive MRSA colonization surveillance program. It is not intended to diagnose MRSA infection nor to guide or monitor treatment for MRSA infections.      Invalid input(s): PROCALCITONIN, LACTICACIDVEN   Radiology Studies: Ct Abdomen Pelvis Wo Contrast  Result Date: 11/26/2015 CLINICAL DATA:  Acute upper gastrointestinal bleeding, fall EXAM: CT ABDOMEN AND PELVIS WITHOUT CONTRAST TECHNIQUE: Multidetector CT imaging of the abdomen and pelvis was performed following the standard protocol without IV contrast. COMPARISON:  None. FINDINGS: Lower chest: lung bases shows atelectasis bilateral lower lobe posteriorly. Hepatobiliary: The study is markedly limited  without IV contrast. There is markedly nodular liver and liver atrophy consistent with cirrhosis. No focal hepatic mass. Gallbladder is contracted without evidence of calcified gallstones. Small perihepatic ascites. Pancreas: Unenhanced pancreas shows no focal mass. There is fluid and mild stranding surrounding the pancreatic head region and adjacent duodenum. No swelling of pancreatic head is noted. Mild pancreatitis cannot be excluded. Spleen: The spleen measures 10.3 cm in length.  No splenomegaly. Adrenals/Urinary Tract: No adrenal gland mass. Kidneys shows a lobulated contour and cortical thinning probable due to atrophy. Probable cyst in midpole of the left kidney posteriorly measures 1 cm. No nephrolithiasis. No hydronephrosis or hydroureter. Stomach/Bowel: There is a small hiatal hernia. There is small stranding and fluid surrounding the proximal stomach. Small varices are noted are noted in axial image 21 just medial to proximal stomach. There is indistinct mixed of the vessel at this level. Small variceal bleed cannot be excluded. No definite hematoma is noted on this unenhanced scan. There is mild  thickening of duodenal wall in mid and distal duodenum. There is adjacent duodenal fluid and stranding. Findings highly suspicious for duodenal inflammation or ulcer disease. In axial image 31 there is tiny amount of extraluminal air just medial to duodenum. Tiny duodenal perforation cannot be excluded. Clinical correlation is necessary. Further correlation with endoscopy is recommended. Oral contrast material was given to the patient. There is no small bowel obstruction. No pericecal inflammation. The terminal ileum is unremarkable. The patient is status post appendectomy. Small amount of free fluid noted in right paracolic gutter. Vascular/Lymphatic: Atherosclerotic calcifications of abdominal aorta and iliac arteries are noted. No aortic aneurysm. There are atherosclerotic calcifications cough and SMA. No retroperitoneal or mesenteric adenopathy. Reproductive: The uterus is atrophic. Small amount of free fluid noted within posterior pelvis. Markedly distended urinary bladder which is to the level of umbilicus. The urinary bladder measures at least 1.7 cm cranial caudally. Other: There is no evidence of subdiaphragmatic free air. No inguinal adenopathy. Musculoskeletal: There is diffuse osteopenia. Degenerative changes thoracolumbar spine. There is significant compression deformity T11 vertebral body of indeterminate age. Clinical correlation is necessary. IMPRESSION: 1. A small hiatal hernia is noted. 2. There is liver atrophy with nodular contour consistent with cirrhosis. The study is limited without IV contrast. No splenomegaly 3. There is small stranding and fluid surrounding the proximal stomach. Small varices are noted are noted in axial image 21 just medial to proximal stomach. There is indistinct mixed of the vessel at this level. Small variceal bleed cannot be excluded. No definite hematoma is noted on this unenhanced scan. There is mild thickening of duodenal wall in mid and distal duodenum. There is  adjacent duodenal fluid and stranding. Findings highly suspicious for duodenal inflammation or ulcer disease. In axial image 31 there is tiny amount of extraluminal air just medial to duodenum. Tiny duodenal perforation cannot be excluded. Clinical correlation is necessary. Further correlation with endoscopy is recommended. 4. Atherosclerotic calcifications of abdominal aorta and iliac arteries are noted. 5. Markedly distended urinary bladder. 6. No small bowel obstruction. 7. Small amount of free fluid noted within posterior pelvis. 8. Degenerative changes thoracolumbar spine. There is significant compression deformity T11 vertebral body of indeterminate age. Clinical correlation is necessary. 9. There is small amount of stranding and fluid adjacent pancreatic head region medial to duodenum. Associated mild pancreatitis cannot be excluded. Clinical correlation is necessary. Electronically Signed   By: Natasha Mead M.D.   On: 11/26/2015 08:09        Scheduled Meds: .  atorvastatin  40 mg Oral q1800  . [START ON 11/29/2015] pantoprazole  40 mg Intravenous Q12H  . sodium chloride flush  3 mL Intravenous Q12H  . sodium chloride flush  3 mL Intravenous Q12H   Continuous Infusions: . pantoprozole (PROTONIX) infusion 8 mg/hr (11/26/15 0400)     LOS: 1 day    Time spent: 35 minutes    Marli Diego A, MD [facility] Pager [phone]  If 7PM-7AM, please contact night-coverage [email or web address] Password [email or web address] 11/26/2015, 10:13 AM

## 2015-11-26 NOTE — Consult Note (Signed)
Referring Provider: Odie Sera, MD Primary Care Physician:  No PCP Per Patient Primary Gastroenterologist:  Gentry Fitz  Reason for Consultation:  Anemia and melena  HPI: Joann Huang is a 82 y.o. female with a history of HTN, HLD, and alcohol abuse (quit 25 years ago), no previous dx of liver disease, who went to [facility] ED from SNF yesterday for 2 large black tarry stools. Was found to have a hemoglobin of 4.5 with lightheadedness, hypotension, and recurrent falls. Emergency transfused with  4 units of PRBC. Hemoglobin last night  was 11.5. She takes ibuprofen intermittently for pain, however on Prilosec. She is on no blood thinners, but takes a baby aspirin. Was started on IV Protonix drip.  Admitted on 7/21-7/24 to [facility] for fall. Ruled in for NSTEMI, plan is for outpatient follow up. UTI. Encephalopathy. Note CK up 1027.   Her LFTs are within normal limits.  Bilirubin and indirect bilirubin are elevated, question if this is transfusion related as this lab was checked after transfusion. BUN is 42.  Platelets were 211 and after transfusion 131.  INR 1.70>>1.36 after transfusion. Prothrombin time 20.2>> 16.8 after transfusion.  CT today small hiatal hernia Liver atrophy with nodular contour consistent with cirrhosis. The study is limited without IV contrast. No splenomegaly Stranding and fluid surrounding the proximal stomach, small varices just medial to proximal stomach, mild thickening of the duodenal wall ? Inflammation or ulcer  Today she denies any pain, nausea, vomiting, dysphagia, lightheadedness, shortness of breath, or chest pain today. She denies any family history of liver disease. 1 Black bowel movement last night. Move to [place] 5 years ago and has had a EGD before but could not remember when.     Past Medical History:  Diagnosis Date  . Acute upper GI bleed 11/25/2015  . High cholesterol   . History of alcohol abuse    quit > 25years ago  . HTN (hypertension)    Not on  any medications  . Hypertension   . Tobacco use     Past Surgical History:  Procedure Laterality Date  . APPENDECTOMY    . CHOLECYSTECTOMY    . KNEE ARTHROSCOPY Left   . TONSILLECTOMY      Prior to Admission medications   Medication Sig Start Date End Date Taking? Authorizing Provider  acetaminophen (TYLENOL) 650 MG CR tablet Take 1,300 mg by mouth daily.   Yes Historical Provider, MD  amLODipine (NORVASC) 5 MG tablet Take 1 tablet (5 mg total) by mouth daily. 11/03/15  Yes Altamese Dilling, MD  aspirin EC 81 MG EC tablet Take 1 tablet (81 mg total) by mouth daily. 11/03/15  Yes Altamese Dilling, MD  atorvastatin (LIPITOR) 40 MG tablet Take 1 tablet (40 mg total) by mouth daily at 6 PM. 11/03/15  Yes Altamese Dilling, MD  metoprolol succinate (TOPROL-XL) 50 MG 24 hr tablet Take 1 tablet (50 mg total) by mouth daily. Take with or immediately following a meal. 11/03/15  Yes Altamese Dilling, MD  omeprazole (PRILOSEC) 20 MG capsule Take 20 mg by mouth daily.   Yes Historical Provider, MD    Current Facility-Administered Medications  Medication Dose Route Frequency Provider Last Rate Last Dose  . 0.9 %  sodium chloride infusion  250 mL Intravenous PRN Briscoe Deutscher, MD      . acetaminophen (TYLENOL) tablet 650 mg  650 mg Oral Q6H PRN Briscoe Deutscher, MD       Or  . acetaminophen (TYLENOL) suppository 650 mg  650 mg Rectal Q6H PRN Briscoe Deutscherimothy S Opyd, MD      . atorvastatin (LIPITOR) tablet 40 mg  40 mg Oral q1800 Lavone Neriimothy S Opyd, MD      . hydrALAZINE (APRESOLINE) injection 5 mg  5 mg Intravenous Q4H PRN Briscoe Deutscherimothy S Opyd, MD      . ondansetron (ZOFRAN) tablet 4 mg  4 mg Oral Q6H PRN Briscoe Deutscherimothy S Opyd, MD       Or  . ondansetron (ZOFRAN) injection 4 mg  4 mg Intravenous Q6H PRN Briscoe Deutscherimothy S Opyd, MD      . pantoprazole (PROTONIX) 80 mg in sodium chloride 0.9 % 250 mL (0.32 mg/mL) infusion  8 mg/hr Intravenous Continuous Briscoe Deutscherimothy S Opyd, MD 25 mL/hr at 11/26/15 0400 8 mg/hr at 11/26/15  0400  . [START ON 11/29/2015] pantoprazole (PROTONIX) injection 40 mg  40 mg Intravenous Q12H Timothy S Opyd, MD      . sodium chloride flush (NS) 0.9 % injection 3 mL  3 mL Intravenous Q12H Briscoe Deutscherimothy S Opyd, MD   3 mL at 11/25/15 2244  . sodium chloride flush (NS) 0.9 % injection 3 mL  3 mL Intravenous Q12H Briscoe Deutscherimothy S Opyd, MD   3 mL at 11/25/15 2243  . sodium chloride flush (NS) 0.9 % injection 3 mL  3 mL Intravenous PRN Briscoe Deutscherimothy S Opyd, MD        Allergies as of 11/25/2015 - Review Complete 11/25/2015  Allergen Reaction Noted  . Sulfa antibiotics Hives and Swelling 10/31/2015  . Penicillins Hives and Other (See Comments) 10/31/2015    Family History  Problem Relation Age of Onset  . CAD Mother     Social History   Social History  . Marital status: Widowed    Spouse name: N/A  . Number of children: N/A  . Years of education: N/A   Occupational History  . Not on file.   Social History Main Topics  . Smoking status: Former Smoker    Packs/day: 1.00    Years: 65.00    Types: Cigarettes    Quit date: 07/12/2015  . Smokeless tobacco: Never Used  . Alcohol use 0.0 oz/week     Comment: h/o heavy alcohol abuse; nothing since 07/2015 (11/25/2015)  . Drug use: No  . Sexual activity: No   Other Topics Concern  . Not on file   Social History Narrative   Lives at home by herself. Has a cane    Review of Systems: Gen: Denies any fever, chills, sweats, anorexia, fatigue, weakness, malaise, weight loss, and sleep disorder CV: Denies chest pain, angina, palpitations, syncope, orthopnea, PND, peripheral edema, and claudication. Resp: Denies dyspnea at rest, dyspnea with exercise, cough, sputum, wheezing, coughing up blood, and pleurisy. GI: Denies vomiting blood, jaundice, and fecal incontinence.   Denies dysphagia or odynophagia. GU : Denies urinary burning, blood in urine, urinary frequency, urinary hesitancy, nocturnal urination, and urinary incontinence. MS: Back pain every now and  then Denies joint pain, limitation of movement, and swelling, stiffness, extremity pain.  Derm: Denies rash, itching, dry skin, hives, moles, warts, or unhealing ulcers.  Psych: Denies depression, anxiety, memory loss, suicidal ideation, hallucinations, paranoia, and confusion. Heme: Bruising Denies, bleeding, and enlarged lymph nodes. Neuro:  Denies any headaches, dizziness, paresthesias. Endo:  Denies any problems with DM, thyroid, adrenal function.  Physical Exam: Vital signs in last 24 hours: Temp:  [96.8 F (36 C)-98.5 F (36.9 C)] 98 F (36.7 C) (08/16 0400) Pulse Rate:  [79-90] 86 (08/16 0051)  Resp:  [16-30] 20 (08/16 0400) BP: (82-151)/(56-103) 132/65 (08/16 0400) SpO2:  [59 %-100 %] 95 % (08/16 0051) Weight:  [164 lb 10.9 oz (74.7 kg)-167 lb (75.8 kg)] 164 lb 10.9 oz (74.7 kg) (08/16 0431) Last BM Date: 11/25/15  General:   Alert,  Well-developed, well-nourished, pleasant and cooperative in NAD Head:  Normocephalic and atraumatic. Eyes:  Sclera clear, no icterus.   Conjunctiva pink. Ears:  Normal auditory acuity. Nose:  No deformity, discharge,  or lesions. Mouth:  No deformity or lesions.   Neck:  Supple; no masses or thyromegaly. Lungs:  Clear throughout to auscultation.   No wheezes, crackles, or rhonchi.  Heart:  Regular rate and rhythm; no murmurs, clicks, rubs,  or gallops. Abdomen:  Soft,nontender, BS active,nonpalp mass or hsm. Large bruise to the right flank s/p fall  Msk:  Symmetrical without gross deformities. Pulses:  Normal pulses noted. Extremities:  Bruising to the extensor surfaces of forearm bilaterally with skin tears. Noclubbing or edema. Neurologic:  Alert and  oriented x2; Psychomotor retardation. grossly normal neurologically. No asterixis  Skin:  Intact without significant lesions or rashes.. Psych:  Alert and cooperative. Normal mood and affect.  Intake/Output from previous day: 08/15 0701 - 08/16 0700 In: 878.8 [P.O.:500; I.V.:178.8; IV  Piggyback:110] Out: -  Intake/Output this shift: No intake/output data recorded.  Lab Results:  Recent Labs  11/25/15 1346 11/25/15 2104  WBC 15.2* 15.0*  HGB 4.5* 11.5*  HCT 14.4* 34.5*  PLT 211 131*   BMET  Recent Labs  11/25/15 1346  NA 137  K 4.2  CL 111  CO2 11*  GLUCOSE 107*  BUN 42*  CREATININE 0.75  CALCIUM 7.0*   LFT  Recent Labs  11/25/15 2104  PROT 4.8*  ALBUMIN 2.1*  AST 30  ALT 17  ALKPHOS 91  BILITOT 2.1*  BILIDIR 0.4  IBILI 1.7*   PT/INR  Recent Labs  11/25/15 1346 11/25/15 2104  LABPROT 20.2* 16.8*  INR 1.70 1.36   Hepatitis Panel No results for input(s): HEPBSAG, HCVAB, HEPAIGM, HEPBIGM in the last 72 hours.    Studies/Results: Ct Abdomen Pelvis Wo Contrast  Result Date: 11/26/2015 CLINICAL DATA:  Acute upper gastrointestinal bleeding, fall EXAM: CT ABDOMEN AND PELVIS WITHOUT CONTRAST TECHNIQUE: Multidetector CT imaging of the abdomen and pelvis was performed following the standard protocol without IV contrast. COMPARISON:  None. FINDINGS: Lower chest: lung bases shows atelectasis bilateral lower lobe posteriorly. Hepatobiliary: The study is markedly limited without IV contrast. There is markedly nodular liver and liver atrophy consistent with cirrhosis. No focal hepatic mass. Gallbladder is contracted without evidence of calcified gallstones. Small perihepatic ascites. Pancreas: Unenhanced pancreas shows no focal mass. There is fluid and mild stranding surrounding the pancreatic head region and adjacent duodenum. No swelling of pancreatic head is noted. Mild pancreatitis cannot be excluded. Spleen: The spleen measures 10.3 cm in length.  No splenomegaly. Adrenals/Urinary Tract: No adrenal gland mass. Kidneys shows a lobulated contour and cortical thinning probable due to atrophy. Probable cyst in midpole of the left kidney posteriorly measures 1 cm. No nephrolithiasis. No hydronephrosis or hydroureter. Stomach/Bowel: There is a small  hiatal hernia. There is small stranding and fluid surrounding the proximal stomach. Small varices are noted are noted in axial image 21 just medial to proximal stomach. There is indistinct mixed of the vessel at this level. Small variceal bleed cannot be excluded. No definite hematoma is noted on this unenhanced scan. There is mild thickening of duodenal wall in mid  and distal duodenum. There is adjacent duodenal fluid and stranding. Findings highly suspicious for duodenal inflammation or ulcer disease. In axial image 31 there is tiny amount of extraluminal air just medial to duodenum. Tiny duodenal perforation cannot be excluded. Clinical correlation is necessary. Further correlation with endoscopy is recommended. Oral contrast material was given to the patient. There is no small bowel obstruction. No pericecal inflammation. The terminal ileum is unremarkable. The patient is status post appendectomy. Small amount of free fluid noted in right paracolic gutter. Vascular/Lymphatic: Atherosclerotic calcifications of abdominal aorta and iliac arteries are noted. No aortic aneurysm. There are atherosclerotic calcifications cough and SMA. No retroperitoneal or mesenteric adenopathy. Reproductive: The uterus is atrophic. Small amount of free fluid noted within posterior pelvis. Markedly distended urinary bladder which is to the level of umbilicus. The urinary bladder measures at least 1.7 cm cranial caudally. Other: There is no evidence of subdiaphragmatic free air. No inguinal adenopathy. Musculoskeletal: There is diffuse osteopenia. Degenerative changes thoracolumbar spine. There is significant compression deformity T11 vertebral body of indeterminate age. Clinical correlation is necessary. IMPRESSION: 1. A small hiatal hernia is noted. 2. There is liver atrophy with nodular contour consistent with cirrhosis. The study is limited without IV contrast. No splenomegaly 3. There is small stranding and fluid surrounding the  proximal stomach. Small varices are noted are noted in axial image 21 just medial to proximal stomach. There is indistinct mixed of the vessel at this level. Small variceal bleed cannot be excluded. No definite hematoma is noted on this unenhanced scan. There is mild thickening of duodenal wall in mid and distal duodenum. There is adjacent duodenal fluid and stranding. Findings highly suspicious for duodenal inflammation or ulcer disease. In axial image 31 there is tiny amount of extraluminal air just medial to duodenum. Tiny duodenal perforation cannot be excluded. Clinical correlation is necessary. Further correlation with endoscopy is recommended. 4. Atherosclerotic calcifications of abdominal aorta and iliac arteries are noted. 5. Markedly distended urinary bladder. 6. No small bowel obstruction. 7. Small amount of free fluid noted within posterior pelvis. 8. Degenerative changes thoracolumbar spine. There is significant compression deformity T11 vertebral body of indeterminate age. Clinical correlation is necessary. 9. There is small amount of stranding and fluid adjacent pancreatic head region medial to duodenum. Associated mild pancreatitis cannot be excluded. Clinical correlation is necessary. Electronically Signed   By: Natasha MeadLiviu  Pop M.D.   On: 11/26/2015 08:09    IMPRESSION:   GI bleed: in patient with a history of alcohol abuse, limited NSAID, new dx of cirrhosis and gastric varices per CT.     Liver cirrhosis ? Encephalopathy, INR and prothrombin time elevated on admission but reduced after transfusion.   Acute blood loss anemia secondary to GI bleed, S/P transfused 4 PRBC with good response. Patient is asymptomatic and stable VS.  Azotemia: secondary to GI bleed, creatinine normal.   Elevated bilirubin/ indirect:  Could be transfusion related, however we have no baseline to compare. Will follow.  Elevated Troponin: Recent NSTEMI. Suspect this could be stress related to anemia.   Low  albumin  PLAN: Will discuss EGD with Dr. Myrtie Neitheranis, but orders placed.   Check ammonia level  Order U/S abdomen   Start octreotide and Cipro for possible variceal bleed (PCN allergy)   Order pre-albumin.   Debbra RidingJason Whitaker PA-S 11/26/2015, 8:24 AM      I have reviewed the entire case in detail with the above APP and discussed the plan in detail.  Therefore, I agree  with the diagnoses recorded above. In addition,  I have personally interviewed and examined the patient and have personally reviewed any abdominal/pelvic CT scan images. Patient's history limited by altered mental status, which her daughter (present during my visit) states has been occurring for several weeks.  My additional thoughts are as follows: Additionally on exam, she does not follow directions well and is not entirely oriented. She has bruises on abd wall and left leg from recent falls.   Cirrhosis, possibly from prior EtOH abuse Melena Anemia of acute GI blood loss Altered mental status - suspected hepatic encephalopathy  Suspect duodenal ulcer from aspirin.  Currently stable BP/HR, and responded well to transfusion.  On acid suppression.  EGD today.  Daughter gave consent. Further plan to follow.  Increased risk procedure due to age and complicated condition    Charlie Pitter III Pager [phone]  Mon-Fri 8a-5p [phone] after 5p, weekends, holidays

## 2015-11-26 NOTE — Progress Notes (Signed)
Patient's left hand IV found out, lying on top of skin, most likely got caught on linens.  Right AC IV found 90% out of insertion site.  Likely migrated out of position.  Both IVs d/cd and dressing placed to sites.

## 2015-11-26 NOTE — Progress Notes (Signed)
Patient's MD found the left AC IV lying on her shoulder.  Last IV is now out.  Order placed to have IV team insert new one.  Mittens obtained for use once IV is in.

## 2015-11-27 ENCOUNTER — Encounter (HOSPITAL_COMMUNITY): Payer: Self-pay | Admitting: Gastroenterology

## 2015-11-27 DIAGNOSIS — R410 Disorientation, unspecified: Secondary | ICD-10-CM

## 2015-11-27 DIAGNOSIS — K264 Chronic or unspecified duodenal ulcer with hemorrhage: Secondary | ICD-10-CM

## 2015-11-27 DIAGNOSIS — K7031 Alcoholic cirrhosis of liver with ascites: Secondary | ICD-10-CM

## 2015-11-27 LAB — CBC
HCT: 33.4 % — ABNORMAL LOW (ref 36.0–46.0)
Hemoglobin: 10.7 g/dL — ABNORMAL LOW (ref 12.0–15.0)
MCH: 29.9 pg (ref 26.0–34.0)
MCHC: 32 g/dL (ref 30.0–36.0)
MCV: 93.3 fL (ref 78.0–100.0)
PLATELETS: 164 10*3/uL (ref 150–400)
RBC: 3.58 MIL/uL — AB (ref 3.87–5.11)
RDW: 16.7 % — AB (ref 11.5–15.5)
WBC: 10.8 10*3/uL — ABNORMAL HIGH (ref 4.0–10.5)

## 2015-11-27 LAB — COMPREHENSIVE METABOLIC PANEL
ALBUMIN: 2.3 g/dL — AB (ref 3.5–5.0)
ALK PHOS: 89 U/L (ref 38–126)
ALT: 20 U/L (ref 14–54)
AST: 36 U/L (ref 15–41)
Anion gap: 8 (ref 5–15)
BUN: 29 mg/dL — AB (ref 6–20)
CALCIUM: 7.6 mg/dL — AB (ref 8.9–10.3)
CHLORIDE: 110 mmol/L (ref 101–111)
CO2: 19 mmol/L — AB (ref 22–32)
CREATININE: 0.76 mg/dL (ref 0.44–1.00)
GFR calc Af Amer: >60 mL/min (ref >60)
GFR calc non Af Amer: >60 mL/min (ref >60)
GLUCOSE: 112 mg/dL — AB (ref 65–99)
Potassium: 3.4 mmol/L — ABNORMAL LOW (ref 3.5–5.1)
SODIUM: 137 mmol/L (ref 135–145)
Total Bilirubin: 1.8 mg/dL — ABNORMAL HIGH (ref 0.3–1.2)
Total Protein: 4.7 g/dL — ABNORMAL LOW (ref 6.5–8.1)

## 2015-11-27 LAB — PREALBUMIN: PREALBUMIN: 7.4 mg/dL — AB (ref 18–38)

## 2015-11-27 LAB — GLUCOSE, CAPILLARY: Glucose-Capillary: 108 mg/dL — ABNORMAL HIGH (ref 65–99)

## 2015-11-27 LAB — HEPATITIS C ANTIBODY

## 2015-11-27 MED ORDER — POTASSIUM CHLORIDE CRYS ER 20 MEQ PO TBCR
40.0000 meq | EXTENDED_RELEASE_TABLET | Freq: Once | ORAL | Status: AC
  Administered 2015-11-27: 40 meq via ORAL
  Filled 2015-11-27: qty 2

## 2015-11-27 MED ORDER — QUETIAPINE FUMARATE 50 MG PO TABS
50.0000 mg | ORAL_TABLET | Freq: Every day | ORAL | Status: DC
  Administered 2015-11-27 – 2015-11-28 (×2): 50 mg via ORAL
  Filled 2015-11-27 (×2): qty 1

## 2015-11-27 NOTE — Progress Notes (Signed)
Received pt in bed with RN and NT, pt is alert, awake, not in any form of distress, tolerated transport well, received report from transferring RN.

## 2015-11-27 NOTE — Progress Notes (Signed)
[facility] Progress Note  Chief Complaint: duodenal ulcer with bleeding, hepatic cirrhosis  Subjective  History:  No reported overt GI bleeding since prior to EGD. Denies abdominal pain , chest pain or dyspnea. No BMs reported since lactulose started.   Objective:  Med list reviewed  Vital signs in last 24 hrs: Vitals:   11/27/15 0400 11/27/15 0825  BP: (!) 143/98 (!) 171/84  Pulse: 89 85  Resp: 17 20  Temp: 97.8 F (36.6 C) 97.6 F (36.4 C)    Physical Exam   HEENT: sclera anicteric, oral mucosa moist without lesions  Neck: supple, no thyromegaly, JVD or lymphadenopathy  Cardiac: RRR without murmurs, S1S2 heard, no peripheral edema  Pulm: clear to auscultation bilaterally, normal RR and effort noted  Abdomen: soft, no tenderness, with active bowel sounds. No guarding or palpable hepatosplenomegaly  Skin; warm and dry, no jaundice or rash She is alert and conversational, but affect flattened and she still seems confused as before.No asterixis.  Normal gross motor function.  Mitts in place because she removed a prior IV Recent Labs:   Recent Labs Lab 11/25/15 2104 11/26/15 1630 11/27/15 0423  WBC 15.0* 12.3* 10.8*  HGB 11.5* 11.5* 10.7*  HCT 34.5* 34.9* 33.4*  PLT 131* 150 164    Recent Labs Lab 11/27/15 0423  NA 137  K 3.4*  CL 110  CO2 19*  BUN 29*  ALBUMIN 2.3*  ALKPHOS 89  ALT 20  AST 36  GLUCOSE 112*  NH3 = 25  Recent Labs Lab 11/25/15 2104  INR 1.36   HCV Ab neg  @ASSESSMENTPLANBEGIN @ Assessment:  Large DU Anemia of acute GI blood loss from ulcer Cirrhosis of unclear cause. Daughter reported that Magda Paganiniudrey was a heavy drinker decades ago. Altered mental status, possibly an element of HE, but must be other factors as well , with normal NH3  Plan:  Increase lactulose to 20 grams three times daily.  Titrate to 2-3 soft BM/day If no improvement in mental status after a week of that, it should be discontinued. Twice daily oral PPI  for 8 weeks. Should see me in the office in 6-8 weeks. No aspirin or NSAIDs I have canceled the abd US, since I see no need for it at present. Signing off, call as need arises  Over half of the 25 minute encounter was spent in counseling and coordination of care.      Charlie PitterHenry L Danis III Pager [phone] Mon-Fri 8a-5p [phone] after 5p, weekends, holidays

## 2015-11-27 NOTE — Progress Notes (Signed)
Pt transferred to 6N16 in bed with RN and NT, with nightgown from home. Pt receiving RN at bedside.

## 2015-11-27 NOTE — Care Management Note (Signed)
Case Management Note  Patient Details  Name: Joann Huang MRN: [id number] Date of Birth: 09/12/1930  Subjective/Objective:   NCM spoke with patient's daughter in room she states [facility] is [facility] and she would like for her to go back there.  Await pt eval, CSW referral.              Action/Plan:   Expected Discharge Date:                  Expected Discharge Plan:  Skilled Nursing Facility  In-House Referral:  Clinical Social Work  Discharge planning Services  CM Consult  Post Acute Care Choice:    Choice offered to:     DME Arranged:    DME Agency:     HH Arranged:    HH Agency:     Status of Service:  In process, will continue to follow  If discussed at Long Length of Stay Meetings, dates discussed:    Additional Comments:  Leone Havenaylor, Lilo Wallington [place], RN 11/27/2015, 5:32 PM

## 2015-11-27 NOTE — Progress Notes (Signed)
PROGRESS NOTE  Joann Huang  ZOX:[id number] DOB: Mar 12, 1931 DOA: 11/25/2015 PCP: No PCP Per Patient Outpatient Specialists:  Subjective: Awake, alert she is oriented only to person and place not to time. She was confused last night has to be restrained to the bed.  Brief Narrative:  Joann Huang is a 82 y.o. female with medical history significant for hypertension and GERD who presents in transfer from [facility] for evaluation of generalized weakness with lightheadedness on standing and melena. , and the patient is not on anticoagulation. She was found to have hemoglobin of 4.5.  Assessment & Plan:   Principal Problem:   Acute upper GI bleed Active Problems:   Essential hypertension   Symptomatic anemia   Leukocytosis   Elevated troponin I level   GERD (gastroesophageal reflux disease)   Fall   Liver cirrhosis (HCC)   Melena   Acute blood loss anemia    Acute upper GI bleed  - ~2 days gen weakness and orthostasis with fall, then 2 lg black tarry stools day of admit  - Had hypotension on EMS arrival and was given 1.5 L NS en route  - Hgb 4.5 on arrival to outside hospital, had been 13.3 on 11/03/15  - Per CT scan small varices versus duodenal ulcer bleed. - Was on Protonix drip and octreotide, EGD done yesterday and showed gastric and duodenal ulcers without active bleeding. -Started on oral Protonix.  Acute encephalopathy -Acute metabolic encephalopathy versus acute delirium, cannot clinically determined. -Do not know the baseline, tried to call daughter Baxter HireKristen without success. -Patient appears to have dementia, could be acute delirium/sundowning versus acute hepatic encephalopathy. -Started on lactulose by GI.  Acute blood loss anemia -Presented with hemoglobin of 4.5, baseline of 13.3. Status post transfusion of 4 units of packed RBCs. -Hemoglobin improved to 11.5  Cirrhosis -This is radiological findings based on CT scan showed shrunken and nodular  liver. -Findings of small varices per CT scan. -INR is 1.7 on admission, check ammonia level.  Elevated troponin  - Likely demand in setting of acute GIB  - Has flat trend, this is likely secondary to severe anemia. -Aspirin is contraindicated because of concurrent bleeding.  GERD - Prilosec at home, CT scan showed duodenal thickening can represent duodenal ulcer.  - Protonix gtt now in setting of suspected acute UGIB  - No EGD on file   Hypertension  - Hold home Norvasc and Toprol given acute bleed and hypotension PTA  - Resume as appropriate   Falls   - Orthostasis secondary to hypovolemia/bleed - CT abd/pel given flank ecchymosis and anemia, no intra-abdominal or retroperitoneal bleeding.    DVT prophylaxis: SCDs Code Status: Full Code Family Communication: No family members at bedside. Disposition Plan:  Diet: Diet NPO time specified  Consultants:   Gastroenterology  Procedures:   None  Antimicrobials:   None   Objective: Vitals:   11/27/15 0008 11/27/15 0400 11/27/15 0446 11/27/15 0825  BP: (!) 142/90 (!) 143/98  (!) 171/84  Pulse:  89  85  Resp:  17  20  Temp:  97.8 F (36.6 C)  97.6 F (36.4 C)  TempSrc:  Axillary  Oral  SpO2:  100%  99%  Weight:   75.5 kg (166 lb 7.2 oz)   Height:        Intake/Output Summary (Last 24 hours) at 11/27/15 1028 Last data filed at 11/27/15 0900  Gross per 24 hour  Intake  3491.67 ml  Output                0 ml  Net          3491.67 ml   Filed Weights   11/26/15 0431 11/26/15 1400 11/27/15 0446  Weight: 74.7 kg (164 lb 10.9 oz) 74.4 kg (164 lb) 75.5 kg (166 lb 7.2 oz)    Examination: General exam: Appears calm and comfortable  Respiratory system: Clear to auscultation. Respiratory effort normal. Cardiovascular system: S1 & S2 heard, RRR. No JVD, murmurs, rubs, gallops or clicks. No pedal edema. Gastrointestinal system: Abdomen is nondistended, soft and nontender. No organomegaly or masses felt.  Normal bowel sounds heard. Central nervous system: Alert and oriented. No focal neurological deficits. Extremities: Symmetric 5 x 5 power. Skin: No rashes, lesions or ulcers Psychiatry: Judgement and insight appear normal. Mood & affect appropriate.   Data Reviewed: I have personally reviewed following labs and imaging studies  CBC:  Recent Labs Lab 11/25/15 1346 11/25/15 2104 11/26/15 1630 11/27/15 0423  WBC 15.2* 15.0* 12.3* 10.8*  NEUTROABS 13.4* 11.0*  --   --   HGB 4.5* 11.5* 11.5* 10.7*  HCT 14.4* 34.5* 34.9* 33.4*  MCV 97.5 91.0 91.8 93.3  PLT 211 131* 150 164   Basic Metabolic Panel:  Recent Labs Lab 11/25/15 1346 11/26/15 0800 11/27/15 0423  NA 137 139 137  K 4.2 3.5 3.4*  CL 111 110 110  CO2 11* 22 19*  GLUCOSE 107* 76 112*  BUN 42* 36* 29*  CREATININE 0.75 0.71 0.76  CALCIUM 7.0* 7.9* 7.6*   GFR: Estimated Creatinine Clearance: 55.3 mL/min (by C-G formula based on SCr of 0.8 mg/dL). Liver Function Tests:  Recent Labs Lab 11/25/15 2104 11/27/15 0423  AST 30 36  ALT 17 20  ALKPHOS 91 89  BILITOT 2.1* 1.8*  PROT 4.8* 4.7*  ALBUMIN 2.1* 2.3*   No results for input(s): LIPASE, AMYLASE in the last 168 hours.  Recent Labs Lab 11/26/15 1050  AMMONIA 25   Coagulation Profile:  Recent Labs Lab 11/25/15 1346 11/25/15 2104  INR 1.70 1.36   Cardiac Enzymes:  Recent Labs Lab 11/25/15 1346 11/25/15 2104 11/26/15 0246 11/26/15 0800  TROPONINI 0.07* 0.11* 0.10* 0.12*   BNP (last 3 results) No results for input(s): PROBNP in the last 8760 hours. HbA1C: No results for input(s): HGBA1C in the last 72 hours. CBG:  Recent Labs Lab 11/26/15 0822 11/27/15 0822  GLUCAP 79 108*   Lipid Profile: No results for input(s): CHOL, HDL, LDLCALC, TRIG, CHOLHDL, LDLDIRECT in the last 72 hours. Thyroid Function Tests:  Recent Labs  11/25/15 2105  TSH 1.455   Anemia Panel: No results for input(s): VITAMINB12, FOLATE, FERRITIN, TIBC, IRON,  RETICCTPCT in the last 72 hours. Urine analysis:    Component Value Date/Time   COLORURINE AMBER (A) 10/31/2015 1819   APPEARANCEUR HAZY (A) 10/31/2015 1819   LABSPEC 1.026 10/31/2015 1819   PHURINE 5.0 10/31/2015 1819   GLUCOSEU NEGATIVE 10/31/2015 1819   HGBUR 2+ (A) 10/31/2015 1819   BILIRUBINUR NEGATIVE 10/31/2015 1819   KETONESUR 1+ (A) 10/31/2015 1819   PROTEINUR 100 (A) 10/31/2015 1819   NITRITE NEGATIVE 10/31/2015 1819   LEUKOCYTESUR 2+ (A) 10/31/2015 1819   Sepsis Labs: @LABRCNTIP (procalcitonin:4,lacticidven:4)  ) Recent Results (from the past 240 hour(s))  MRSA PCR Screening     Status: None   Collection Time: 11/26/15  1:00 AM  Result Value Ref Range Status   MRSA by PCR NEGATIVE NEGATIVE  Final    Comment:        The GeneXpert MRSA Assay (FDA approved for NASAL specimens only), is one component of a comprehensive MRSA colonization surveillance program. It is not intended to diagnose MRSA infection nor to guide or monitor treatment for MRSA infections.      Invalid input(s): PROCALCITONIN, LACTICACIDVEN   Radiology Studies: Ct Abdomen Pelvis Wo Contrast  Result Date: 11/26/2015 CLINICAL DATA:  Acute upper gastrointestinal bleeding, fall EXAM: CT ABDOMEN AND PELVIS WITHOUT CONTRAST TECHNIQUE: Multidetector CT imaging of the abdomen and pelvis was performed following the standard protocol without IV contrast. COMPARISON:  None. FINDINGS: Lower chest: lung bases shows atelectasis bilateral lower lobe posteriorly. Hepatobiliary: The study is markedly limited without IV contrast. There is markedly nodular liver and liver atrophy consistent with cirrhosis. No focal hepatic mass. Gallbladder is contracted without evidence of calcified gallstones. Small perihepatic ascites. Pancreas: Unenhanced pancreas shows no focal mass. There is fluid and mild stranding surrounding the pancreatic head region and adjacent duodenum. No swelling of pancreatic head is noted. Mild  pancreatitis cannot be excluded. Spleen: The spleen measures 10.3 cm in length.  No splenomegaly. Adrenals/Urinary Tract: No adrenal gland mass. Kidneys shows a lobulated contour and cortical thinning probable due to atrophy. Probable cyst in midpole of the left kidney posteriorly measures 1 cm. No nephrolithiasis. No hydronephrosis or hydroureter. Stomach/Bowel: There is a small hiatal hernia. There is small stranding and fluid surrounding the proximal stomach. Small varices are noted are noted in axial image 21 just medial to proximal stomach. There is indistinct mixed of the vessel at this level. Small variceal bleed cannot be excluded. No definite hematoma is noted on this unenhanced scan. There is mild thickening of duodenal wall in mid and distal duodenum. There is adjacent duodenal fluid and stranding. Findings highly suspicious for duodenal inflammation or ulcer disease. In axial image 31 there is tiny amount of extraluminal air just medial to duodenum. Tiny duodenal perforation cannot be excluded. Clinical correlation is necessary. Further correlation with endoscopy is recommended. Oral contrast material was given to the patient. There is no small bowel obstruction. No pericecal inflammation. The terminal ileum is unremarkable. The patient is status post appendectomy. Small amount of free fluid noted in right paracolic gutter. Vascular/Lymphatic: Atherosclerotic calcifications of abdominal aorta and iliac arteries are noted. No aortic aneurysm. There are atherosclerotic calcifications cough and SMA. No retroperitoneal or mesenteric adenopathy. Reproductive: The uterus is atrophic. Small amount of free fluid noted within posterior pelvis. Markedly distended urinary bladder which is to the level of umbilicus. The urinary bladder measures at least 1.7 cm cranial caudally. Other: There is no evidence of subdiaphragmatic free air. No inguinal adenopathy. Musculoskeletal: There is diffuse osteopenia. Degenerative  changes thoracolumbar spine. There is significant compression deformity T11 vertebral body of indeterminate age. Clinical correlation is necessary. IMPRESSION: 1. A small hiatal hernia is noted. 2. There is liver atrophy with nodular contour consistent with cirrhosis. The study is limited without IV contrast. No splenomegaly 3. There is small stranding and fluid surrounding the proximal stomach. Small varices are noted are noted in axial image 21 just medial to proximal stomach. There is indistinct mixed of the vessel at this level. Small variceal bleed cannot be excluded. No definite hematoma is noted on this unenhanced scan. There is mild thickening of duodenal wall in mid and distal duodenum. There is adjacent duodenal fluid and stranding. Findings highly suspicious for duodenal inflammation or ulcer disease. In axial image 31 there is tiny  amount of extraluminal air just medial to duodenum. Tiny duodenal perforation cannot be excluded. Clinical correlation is necessary. Further correlation with endoscopy is recommended. 4. Atherosclerotic calcifications of abdominal aorta and iliac arteries are noted. 5. Markedly distended urinary bladder. 6. No small bowel obstruction. 7. Small amount of free fluid noted within posterior pelvis. 8. Degenerative changes thoracolumbar spine. There is significant compression deformity T11 vertebral body of indeterminate age. Clinical correlation is necessary. 9. There is small amount of stranding and fluid adjacent pancreatic head region medial to duodenum. Associated mild pancreatitis cannot be excluded. Clinical correlation is necessary. Electronically Signed   By: Natasha Mead M.D.   On: 11/26/2015 08:09        Scheduled Meds: . atorvastatin  40 mg Oral q1800  . lactulose  20 g Oral BID  . pantoprazole  40 mg Oral BID AC   Continuous Infusions: . sodium chloride 100 mL/hr at 11/27/15 0700     LOS: 2 days    Time spent: 35 minutes    Davanta Meuser A,  MD [facility] Pager [phone]  If 7PM-7AM, please contact night-coverage [email or web address] Password [facility] 11/27/2015, 10:28 AM

## 2015-11-28 DIAGNOSIS — K269 Duodenal ulcer, unspecified as acute or chronic, without hemorrhage or perforation: Secondary | ICD-10-CM

## 2015-11-28 LAB — COMPREHENSIVE METABOLIC PANEL
ALBUMIN: 2.1 g/dL — AB (ref 3.5–5.0)
ALK PHOS: 84 U/L (ref 38–126)
ALT: 17 U/L (ref 14–54)
ANION GAP: 5 (ref 5–15)
AST: 41 U/L (ref 15–41)
BILIRUBIN TOTAL: 1.6 mg/dL — AB (ref 0.3–1.2)
BUN: 20 mg/dL (ref 6–20)
CALCIUM: 7.4 mg/dL — AB (ref 8.9–10.3)
CO2: 23 mmol/L (ref 22–32)
Chloride: 110 mmol/L (ref 101–111)
Creatinine, Ser: 0.68 mg/dL (ref 0.44–1.00)
GFR calc Af Amer: >60 mL/min (ref >60)
GFR calc non Af Amer: >60 mL/min (ref >60)
GLUCOSE: 96 mg/dL (ref 65–99)
Potassium: 3.9 mmol/L (ref 3.5–5.1)
Sodium: 138 mmol/L (ref 135–145)
TOTAL PROTEIN: 4.1 g/dL — AB (ref 6.5–8.1)

## 2015-11-28 LAB — CBC
HEMATOCRIT: 29.8 % — AB (ref 36.0–46.0)
HEMOGLOBIN: 9.6 g/dL — AB (ref 12.0–15.0)
MCH: 30.3 pg (ref 26.0–34.0)
MCHC: 32.2 g/dL (ref 30.0–36.0)
MCV: 94 fL (ref 78.0–100.0)
Platelets: 171 10*3/uL (ref 150–400)
RBC: 3.17 MIL/uL — ABNORMAL LOW (ref 3.87–5.11)
RDW: 16.8 % — ABNORMAL HIGH (ref 11.5–15.5)
WBC: 8.3 10*3/uL (ref 4.0–10.5)

## 2015-11-28 MED ORDER — PANTOPRAZOLE SODIUM 40 MG PO TBEC: 40.0000 mg | DELAYED_RELEASE_TABLET | Freq: Two times a day (BID) | ORAL | Status: AC

## 2015-11-28 NOTE — [place] FL2 (Signed)
[place] MEDICAID FL2 LEVEL OF CARE SCREENING TOOL     IDENTIFICATION  Patient Name: Joann Huang Birthdate: 1930-07-02 Sex: female Admission Date (Current Location): 11/25/2015  [facility] [place]   Facility and Address:  [facility] [place], [address], [place], [place] [address]      Provider Number: [id number]  Attending Physician Name and Address:  Clydia LlanoMutaz Elmahi, MD  Relative Name and Phone Number:       Current Level of Care: Hospital Recommended Level of Care: Skilled Nursing Facility Prior Approval Number:    Date Approved/Denied:   PASRR Number:    Discharge Plan: SNF    Current Diagnoses: Patient Active Problem List   Diagnosis Date Noted  . Liver cirrhosis (HCC) 11/26/2015  . Fall   . Melena   . Acute blood loss anemia   . Duodenal ulcer 11/25/2015  . Acute upper GI bleed 11/25/2015  . Leukocytosis 11/25/2015  . Elevated troponin I level 11/25/2015  . GERD (gastroesophageal reflux disease) 11/25/2015  . Weakness of both lower extremities 11/01/2015  . UTI (urinary tract infection) - with encephalopathy 11/01/2015  . Essential hypertension 11/01/2015  . Metabolic encephalopathy - related to UTI 11/01/2015  . NSTEMI (non-ST elevated myocardial infarction) (HCC) 10/31/2015    Orientation RESPIRATION BLADDER Height & Weight     Self  O2 Incontinent Weight: 169 lb 8.5 oz (76.9 kg) Height:  5\' 6"  (167.6 cm)  BEHAVIORAL SYMPTOMS/MOOD NEUROLOGICAL BOWEL NUTRITION STATUS      Incontinent    AMBULATORY STATUS COMMUNICATION OF NEEDS Skin   Extensive Assist Verbally Normal                       Personal Care Assistance Level of Assistance  Bathing, Dressing Bathing Assistance: Maximum assistance   Dressing Assistance: Maximum assistance     Functional Limitations Info             SPECIAL CARE FACTORS FREQUENCY                       Contractures      Additional Factors Info                   Current Medications (11/28/2015):  This is the current hospital active medication list Current Facility-Administered Medications  Medication Dose Route Frequency Provider Last Rate Last Dose  . acetaminophen (TYLENOL) tablet 650 mg  650 mg Oral Q6H PRN Briscoe Deutscherimothy S Opyd, MD       Or  . acetaminophen (TYLENOL) suppository 650 mg  650 mg Rectal Q6H PRN Briscoe Deutscherimothy S Opyd, MD      . atorvastatin (LIPITOR) tablet 40 mg  40 mg Oral q1800 Briscoe Deutscherimothy S Opyd, MD   40 mg at 11/27/15 1643  . hydrALAZINE (APRESOLINE) injection 5 mg  5 mg Intravenous Q4H PRN Briscoe Deutscherimothy S Opyd, MD      . lactulose (CHRONULAC) 10 GM/15ML solution 20 g  20 g Oral BID Charlie PitterHenry L Danis III, MD   20 g at 11/28/15 [id number]  . ondansetron (ZOFRAN) tablet 4 mg  4 mg Oral Q6H PRN Briscoe Deutscherimothy S Opyd, MD       Or  . ondansetron (ZOFRAN) injection 4 mg  4 mg Intravenous Q6H PRN Lavone Neriimothy S Opyd, MD      . pantoprazole (PROTONIX) EC tablet 40 mg  40 mg Oral BID AC Charlie PitterHenry L Danis III, MD   40 mg  at 11/28/15 [id number]  . QUEtiapine (SEROQUEL) tablet 50 mg  50 mg Oral QHS Clydia LlanoMutaz Elmahi, MD   50 mg at 11/27/15 2209  . sodium chloride flush (NS) 0.9 % injection 3 mL  3 mL Intravenous PRN Briscoe Deutscherimothy S Opyd, MD         Discharge Medications: Please see discharge summary for a list of discharge medications.  Relevant Imaging Results:  Relevant Lab Results:   Additional Information  (SS [id number])  Ayahna Solazzo, [place] R

## 2015-11-28 NOTE — Discharge Summary (Signed)
Physician Discharge Summary  Joann Huang ZOX:[id number] DOB: Aug 15, 1930 DOA: 11/25/2015  PCP: No PCP Per Patient  Admit date: 11/25/2015 Discharge date: 11/28/2015  Admitted From: SNF Disposition: SNF  Recommendations for Outpatient Follow-up:  1. Follow up with PCP in 1-2 weeks 2. Please obtain BMP/CBC in one week 3. Started on pantoprazole 40 mg twice a day. Avoid any NSAIDs  Home Health: N/A Equipment/Devices: N/A  Discharge Condition: Stable CODE STATUS: Full code Diet recommendation: Heart Healthy  Brief/Interim Summary: Joann Huang is a 82 y.o. female with medical history significant for hypertension and GERD who presents in transfer from [facility] for evaluation of generalized weakness with lightheadedness on standing and melena. Patient had reportedly been in her usual state until the development of generalized weakness over the past couple days as well as lightheadedness upon standing which has resulted in a fall. There was no head strike reported with the fall, no loss of consciousness, and the patient is not on anticoagulation. She had right flank pain following the fall and there is a large ecchymosis there. Today, the patient was noted to have 2 large black tarry stools and was transported to [facility] for further evaluation of this. She was reportedly hypotensive on the scene with EMS and 1.5 L of normal saline was bolused en route to the hospital. She uses NSAID when necessary and takes a daily Prilosec.  Discharge Diagnoses:  Principal Problem:   Acute upper GI bleed Active Problems:   Essential hypertension   Duodenal ulcer   Leukocytosis   Elevated troponin I level   GERD (gastroesophageal reflux disease)   Fall   Liver cirrhosis (HCC)   Melena   Acute blood loss anemia     Acute upper GI bleed  - ~2 days gen weakness and orthostasis with fall, then 2 lg black tarry stools day of admit  - Had hypotension on EMS arrival and was given 1.5  L NS en route  - Hgb 4.5 on arrival to outside hospital, had been 13.3 on 11/03/15  - Per CT scan small varices versus duodenal ulcer bleed. - Was on Protonix drip and octreotide, EGD done yesterday and showed gastric and duodenal ulcers without active bleeding. -Started on oral Protonix twice a day, avoid NSAIDs.  Acute encephalopathy -Acute hepatic encephalopathy versus acute delirium, cannot clinically determined. -Patient appears to have dementia, could be acute delirium/sundowning versus acute hepatic encephalopathy. -Was on lactulose briefly in the hospital, has normal ammonia, I don't think this is acute hepatic encephalopathy. -This is likely acute delirium/sundowning on top of her dementia, checked with her daughter prior to discharge she is back to her baseline.  Acute blood loss anemia -Presented with hemoglobin of 4.5, baseline of 13.3. Status post transfusion of 4 units of packed RBCs. -Hemoglobin improved to 11.5 just after transfusion, on day of discharge is 9.6. -Continue to check hemoglobin every week.  Cirrhosis -This is radiological findings based on CT scan showed shrunken and nodular liver. -Findings of small varices per CT scan. -INR is 1.7 on admission, normal ammonia  Elevated troponin  - Likely demand in setting of acute GIB  - Has flat trend, this is likely secondary to severe anemia. -Aspirin is contraindicated because of concurrent bleeding.  GERD - Prilosec at home, CT scan showed duodenal thickening can represent duodenal ulcer.  - EGD showed gastric and duodenal ulcers, Prilosec switched to twice a day Protonix.  Hypertension  - Hold home Norvasc and Toprol given acute bleed and  hypotension PTA  - Resume as appropriate   Falls  - Orthostasis secondary to hypovolemia/bleed - CT abd/pel given flank ecchymosis and anemia, no intra-abdominal or retroperitoneal bleeding.  Discharge Instructions  Discharge Instructions    Diet - low sodium  heart healthy    Complete by:  As directed   Increase activity slowly    Complete by:  As directed       Medication List    STOP taking these medications   aspirin 81 MG EC tablet   omeprazole 20 MG capsule Commonly known as:  PRILOSEC     TAKE these medications   acetaminophen 650 MG CR tablet Commonly known as:  TYLENOL Take 1,300 mg by mouth daily.   amLODipine 5 MG tablet Commonly known as:  NORVASC Take 1 tablet (5 mg total) by mouth daily.   atorvastatin 40 MG tablet Commonly known as:  LIPITOR Take 1 tablet (40 mg total) by mouth daily at 6 PM.   metoprolol succinate 50 MG 24 hr tablet Commonly known as:  TOPROL-XL Take 1 tablet (50 mg total) by mouth daily. Take with or immediately following a meal.   pantoprazole 40 MG tablet Commonly known as:  PROTONIX Take 1 tablet (40 mg total) by mouth 2 (two) times daily before a meal.       Allergies  Allergen Reactions  . Sulfa Antibiotics Hives and Swelling  . Penicillins Hives and Other (See Comments)    Has patient had a PCN reaction causing immediate rash, facial/tongue/throat swelling, SOB or lightheadedness with hypotension: yes Has patient had a PCN reaction causing severe rash involving mucus membranes or skin necrosis: No Has patient had a PCN reaction that required hospitalization No Has patient had a PCN reaction occurring within the last 10 years: No If all of the above answers are "NO", then may proceed with Cephalosporin use.    Consultations:  Dr. Myrtie Neitheranis, [facility] gastroenterology   Procedures/Studies: Ct Abdomen Pelvis Wo Contrast  Result Date: 11/26/2015 CLINICAL DATA:  Acute upper gastrointestinal bleeding, fall EXAM: CT ABDOMEN AND PELVIS WITHOUT CONTRAST TECHNIQUE: Multidetector CT imaging of the abdomen and pelvis was performed following the standard protocol without IV contrast. COMPARISON:  None. FINDINGS: Lower chest: lung bases shows atelectasis bilateral lower lobe posteriorly.  Hepatobiliary: The study is markedly limited without IV contrast. There is markedly nodular liver and liver atrophy consistent with cirrhosis. No focal hepatic mass. Gallbladder is contracted without evidence of calcified gallstones. Small perihepatic ascites. Pancreas: Unenhanced pancreas shows no focal mass. There is fluid and mild stranding surrounding the pancreatic head region and adjacent duodenum. No swelling of pancreatic head is noted. Mild pancreatitis cannot be excluded. Spleen: The spleen measures 10.3 cm in length.  No splenomegaly. Adrenals/Urinary Tract: No adrenal gland mass. Kidneys shows a lobulated contour and cortical thinning probable due to atrophy. Probable cyst in midpole of the left kidney posteriorly measures 1 cm. No nephrolithiasis. No hydronephrosis or hydroureter. Stomach/Bowel: There is a small hiatal hernia. There is small stranding and fluid surrounding the proximal stomach. Small varices are noted are noted in axial image 21 just medial to proximal stomach. There is indistinct mixed of the vessel at this level. Small variceal bleed cannot be excluded. No definite hematoma is noted on this unenhanced scan. There is mild thickening of duodenal wall in mid and distal duodenum. There is adjacent duodenal fluid and stranding. Findings highly suspicious for duodenal inflammation or ulcer disease. In axial image 31 there is tiny amount of extraluminal  air just medial to duodenum. Tiny duodenal perforation cannot be excluded. Clinical correlation is necessary. Further correlation with endoscopy is recommended. Oral contrast material was given to the patient. There is no small bowel obstruction. No pericecal inflammation. The terminal ileum is unremarkable. The patient is status post appendectomy. Small amount of free fluid noted in right paracolic gutter. Vascular/Lymphatic: Atherosclerotic calcifications of abdominal aorta and iliac arteries are noted. No aortic aneurysm. There are  atherosclerotic calcifications cough and SMA. No retroperitoneal or mesenteric adenopathy. Reproductive: The uterus is atrophic. Small amount of free fluid noted within posterior pelvis. Markedly distended urinary bladder which is to the level of umbilicus. The urinary bladder measures at least 1.7 cm cranial caudally. Other: There is no evidence of subdiaphragmatic free air. No inguinal adenopathy. Musculoskeletal: There is diffuse osteopenia. Degenerative changes thoracolumbar spine. There is significant compression deformity T11 vertebral body of indeterminate age. Clinical correlation is necessary. IMPRESSION: 1. A small hiatal hernia is noted. 2. There is liver atrophy with nodular contour consistent with cirrhosis. The study is limited without IV contrast. No splenomegaly 3. There is small stranding and fluid surrounding the proximal stomach. Small varices are noted are noted in axial image 21 just medial to proximal stomach. There is indistinct mixed of the vessel at this level. Small variceal bleed cannot be excluded. No definite hematoma is noted on this unenhanced scan. There is mild thickening of duodenal wall in mid and distal duodenum. There is adjacent duodenal fluid and stranding. Findings highly suspicious for duodenal inflammation or ulcer disease. In axial image 31 there is tiny amount of extraluminal air just medial to duodenum. Tiny duodenal perforation cannot be excluded. Clinical correlation is necessary. Further correlation with endoscopy is recommended. 4. Atherosclerotic calcifications of abdominal aorta and iliac arteries are noted. 5. Markedly distended urinary bladder. 6. No small bowel obstruction. 7. Small amount of free fluid noted within posterior pelvis. 8. Degenerative changes thoracolumbar spine. There is significant compression deformity T11 vertebral body of indeterminate age. Clinical correlation is necessary. 9. There is small amount of stranding and fluid adjacent pancreatic  head region medial to duodenum. Associated mild pancreatitis cannot be excluded. Clinical correlation is necessary. Electronically Signed   By: Natasha MeadLiviu  Pop M.D.   On: 11/26/2015 08:09   Dg Chest 2 View  Result Date: 10/31/2015 CLINICAL DATA:  Initial encounter for Pts daughter states she is getting weaker for the last two weeks now. Per pt she lifting something heacy a week ago and heard a popping sound. Pt is complaining of right sided rib pain since then. Smoker. Hx of HTN EXAM: CHEST  2 VIEW COMPARISON:  None. FINDINGS: Hyperinflation. Midline trachea. Normal heart size. Atherosclerosis in the transverse aorta. No pleural effusion or pneumothorax. There may be mild left base scarring. IMPRESSION: No acute cardiopulmonary disease. Aortic atherosclerosis. Electronically Signed   By: Jeronimo GreavesKyle  Talbot M.D.   On: 10/31/2015 19:19   Ct Head Wo Contrast  Result Date: 10/31/2015 CLINICAL DATA:  Progressive weakness, altered mental status, recent fall with head injury EXAM: CT HEAD WITHOUT CONTRAST TECHNIQUE: Contiguous axial images were obtained from the base of the skull through the vertex without intravenous contrast. COMPARISON:  None. FINDINGS: Brain: Age related brain atrophy evident with extensive chronic white matter microvascular ischemic changes throughout the cerebral hemispheres. No acute intracranial hemorrhage, mass lesion, definite new infarction, midline shift, herniation, hydrocephalus, or extra-axial fluid collection. Cisterns are patent. Cerebellar atrophy as well. Vascular: No hyperdense vessel or unexpected calcification. Skull: Negative for fracture  or focal lesion. Sinuses/Orbits: No acute findings. Other: None. IMPRESSION: Age related brain atrophy and extensive white matter small vessel ischemic changes. No acute process by noncontrast CT. Electronically Signed   By: Judie Petit.  Shick M.D.   On: 10/31/2015 17:31    EGD done on 11/26/2015 Impression:                - Medium-sized hiatal hernia. -  Normal stomach. - Gastric ulcer. Biopsied. - A single non-bleeding angiodysplastic lesion in the stomach. - One non-bleeding duodenal ulcer with pigmented material.   Subjective:   Discharge Exam: Vitals:   11/27/15 2240 11/28/15 0619  BP: (!) 147/73 (!) 158/73  Pulse: 78 76  Resp: 19 18  Temp: 98 F (36.7 C) 97.9 F (36.6 C)   Vitals:   11/27/15 1500 11/27/15 1600 11/27/15 2240 11/28/15 0619  BP: (!) 169/90 (!) 159/80 (!) 147/73 (!) 158/73  Pulse: 80 84 78 76  Resp: (!) 21 20 19 18   Temp: 97.4 F (36.3 C)  98 F (36.7 C) 97.9 F (36.6 C)  TempSrc: Oral  Oral Oral  SpO2: 99% 98% 98% 98%  Weight:   77 kg (169 lb 12.1 oz) 76.9 kg (169 lb 8.5 oz)  Height:   5\' 6"  (1.676 m)     General: Pt is alert, awake, not in acute distress Cardiovascular: RRR, S1/S2 +, no rubs, no gallops Respiratory: CTA bilaterally, no wheezing, no rhonchi Abdominal: Soft, NT, ND, bowel sounds + Extremities: no edema, no cyanosis    The results of significant diagnostics from this hospitalization (including imaging, microbiology, ancillary and laboratory) are listed below for reference.     Microbiology: Recent Results (from the past 240 hour(s))  MRSA PCR Screening     Status: None   Collection Time: 11/26/15  1:00 AM  Result Value Ref Range Status   MRSA by PCR NEGATIVE NEGATIVE Final    Comment:        The GeneXpert MRSA Assay (FDA approved for NASAL specimens only), is one component of a comprehensive MRSA colonization surveillance program. It is not intended to diagnose MRSA infection nor to guide or monitor treatment for MRSA infections.      Labs: BNP (last 3 results) No results for input(s): BNP in the last 8760 hours. Basic Metabolic Panel:  Recent Labs Lab 11/25/15 1346 11/26/15 0800 11/27/15 0423 11/28/15 0432  NA 137 139 137 138  K 4.2 3.5 3.4* 3.9  CL 111 110 110 110  CO2 11* 22 19* 23  GLUCOSE 107* 76 112* 96  BUN 42* 36* 29* 20  CREATININE 0.75 0.71  0.76 0.68  CALCIUM 7.0* 7.9* 7.6* 7.4*   Liver Function Tests:  Recent Labs Lab 11/25/15 2104 11/27/15 0423 11/28/15 0432  AST 30 36 41  ALT 17 20 17   ALKPHOS 91 89 84  BILITOT 2.1* 1.8* 1.6*  PROT 4.8* 4.7* 4.1*  ALBUMIN 2.1* 2.3* 2.1*   No results for input(s): LIPASE, AMYLASE in the last 168 hours.  Recent Labs Lab 11/26/15 1050  AMMONIA 25   CBC:  Recent Labs Lab 11/25/15 1346 11/25/15 2104 11/26/15 1630 11/27/15 0423 11/28/15 0432  WBC 15.2* 15.0* 12.3* 10.8* 8.3  NEUTROABS 13.4* 11.0*  --   --   --   HGB 4.5* 11.5* 11.5* 10.7* 9.6*  HCT 14.4* 34.5* 34.9* 33.4* 29.8*  MCV 97.5 91.0 91.8 93.3 94.0  PLT 211 131* 150 164 171   Cardiac Enzymes:  Recent Labs Lab 11/25/15 1346 11/25/15  2104 11/26/15 0246 11/26/15 0800  TROPONINI 0.07* 0.11* 0.10* 0.12*   BNP: Invalid input(s): POCBNP CBG:  Recent Labs Lab 11/26/15 0822 11/27/15 0822  GLUCAP 79 108*   D-Dimer No results for input(s): DDIMER in the last 72 hours. Hgb A1c No results for input(s): HGBA1C in the last 72 hours. Lipid Profile No results for input(s): CHOL, HDL, LDLCALC, TRIG, CHOLHDL, LDLDIRECT in the last 72 hours. Thyroid function studies  Recent Labs  11/25/15 2105  TSH 1.455   Anemia work up No results for input(s): VITAMINB12, FOLATE, FERRITIN, TIBC, IRON, RETICCTPCT in the last 72 hours. Urinalysis    Component Value Date/Time   COLORURINE AMBER (A) 10/31/2015 1819   APPEARANCEUR HAZY (A) 10/31/2015 1819   LABSPEC 1.026 10/31/2015 1819   PHURINE 5.0 10/31/2015 1819   GLUCOSEU NEGATIVE 10/31/2015 1819   HGBUR 2+ (A) 10/31/2015 1819   BILIRUBINUR NEGATIVE 10/31/2015 1819   KETONESUR 1+ (A) 10/31/2015 1819   PROTEINUR 100 (A) 10/31/2015 1819   NITRITE NEGATIVE 10/31/2015 1819   LEUKOCYTESUR 2+ (A) 10/31/2015 1819   Sepsis Labs Invalid input(s): PROCALCITONIN,  WBC,  LACTICIDVEN Microbiology Recent Results (from the past 240 hour(s))  MRSA PCR Screening      Status: None   Collection Time: 11/26/15  1:00 AM  Result Value Ref Range Status   MRSA by PCR NEGATIVE NEGATIVE Final    Comment:        The GeneXpert MRSA Assay (FDA approved for NASAL specimens only), is one component of a comprehensive MRSA colonization surveillance program. It is not intended to diagnose MRSA infection nor to guide or monitor treatment for MRSA infections.      Time coordinating discharge: Over 30 minutes  SIGNED:   Clint Lipps, MD  [facility] 11/28/2015, 10:43 AM Pager   If 7PM-7AM, please contact night-coverage [email or web address] Password [email or web address]

## 2015-11-28 NOTE — Progress Notes (Signed)
[facility] here to transport pt to [facility] facility.

## 2015-11-28 NOTE — Care Management Important Message (Signed)
Important Message  Patient Details  Name: Joann Huang MRN: [id number] Date of Birth: 1931-01-08   Medicare Important Message Given:  Yes    Dorena BodoIris Tupac Jeffus 11/28/2015, 10:58 AM

## 2015-11-28 NOTE — Progress Notes (Signed)
SW was notified that pt is being discharged today and will return to [facility]. [facility] and staff confirm that [facility] is from facility and [facility]. SW provided nurse with Nurse report number.  SW will call transportation.

## 2015-11-28 NOTE — Care Management Note (Signed)
Case Management Note  Patient Details  Name: Zara Counciludrey J Niven MRN: [id number] Date of Birth: 08/17/1930  Subjective/Objective:  Pt medically stable for dc today.                    Action/Plan: Plan return to SNF as prior to admission, per CSW arrangements.    Expected Discharge Date:                  Expected Discharge Plan:  Skilled Nursing Facility  In-House Referral:  Clinical Social Work  Discharge planning Services  CM Consult  Post Acute Care Choice:    Choice offered to:     DME Arranged:    DME Agency:     HH Arranged:    HH Agency:     Status of Service:  Completed, signed off  If discussed at [employer] Meetings, dates discussed:    Additional Comments:  Glennon Macmerson, Mireille Lacombe M, RN 11/28/2015, 11:19 AM

## 2015-11-28 NOTE — Progress Notes (Signed)
Report given to [facility] facility.

## 2015-11-29 LAB — TYPE AND SCREEN
ABO/RH(D): O POS
ANTIBODY SCREEN: NEGATIVE
Unit division: 0
Unit division: 0

## 2015-12-05 ENCOUNTER — Inpatient Hospital Stay: Payer: Medicare Other

## 2015-12-05 ENCOUNTER — Inpatient Hospital Stay
Admission: EM | Admit: 2015-12-05 | Discharge: 2015-12-10 | DRG: 378 | Disposition: A | Discharge status: Skilled Nursing Facility | Payer: Medicare Other | Attending: Internal Medicine | Admitting: Internal Medicine

## 2015-12-05 ENCOUNTER — Emergency Department: Payer: Medicare Other

## 2015-12-05 DIAGNOSIS — Z8711 Personal history of peptic ulcer disease: Secondary | ICD-10-CM | POA: Diagnosis not present

## 2015-12-05 DIAGNOSIS — Z87891 Personal history of nicotine dependence: Secondary | ICD-10-CM

## 2015-12-05 DIAGNOSIS — Z882 Allergy status to sulfonamides status: Secondary | ICD-10-CM | POA: Diagnosis not present

## 2015-12-05 DIAGNOSIS — I252 Old myocardial infarction: Secondary | ICD-10-CM

## 2015-12-05 DIAGNOSIS — R7989 Other specified abnormal findings of blood chemistry: Secondary | ICD-10-CM | POA: Diagnosis present

## 2015-12-05 DIAGNOSIS — K921 Melena: Principal | ICD-10-CM | POA: Diagnosis present

## 2015-12-05 DIAGNOSIS — F0390 Unspecified dementia without behavioral disturbance: Secondary | ICD-10-CM | POA: Diagnosis present

## 2015-12-05 DIAGNOSIS — Z8249 Family history of ischemic heart disease and other diseases of the circulatory system: Secondary | ICD-10-CM | POA: Diagnosis not present

## 2015-12-05 DIAGNOSIS — K7460 Unspecified cirrhosis of liver: Secondary | ICD-10-CM | POA: Diagnosis present

## 2015-12-05 DIAGNOSIS — K219 Gastro-esophageal reflux disease without esophagitis: Secondary | ICD-10-CM | POA: Diagnosis present

## 2015-12-05 DIAGNOSIS — K269 Duodenal ulcer, unspecified as acute or chronic, without hemorrhage or perforation: Secondary | ICD-10-CM | POA: Diagnosis present

## 2015-12-05 DIAGNOSIS — Z79899 Other long term (current) drug therapy: Secondary | ICD-10-CM

## 2015-12-05 DIAGNOSIS — K7030 Alcoholic cirrhosis of liver without ascites: Secondary | ICD-10-CM | POA: Diagnosis present

## 2015-12-05 DIAGNOSIS — R778 Other specified abnormalities of plasma proteins: Secondary | ICD-10-CM | POA: Diagnosis present

## 2015-12-05 DIAGNOSIS — K254 Chronic or unspecified gastric ulcer with hemorrhage: Secondary | ICD-10-CM | POA: Diagnosis present

## 2015-12-05 DIAGNOSIS — K449 Diaphragmatic hernia without obstruction or gangrene: Secondary | ICD-10-CM | POA: Diagnosis present

## 2015-12-05 DIAGNOSIS — D649 Anemia, unspecified: Secondary | ICD-10-CM

## 2015-12-05 DIAGNOSIS — K922 Gastrointestinal hemorrhage, unspecified: Secondary | ICD-10-CM | POA: Diagnosis not present

## 2015-12-05 DIAGNOSIS — D62 Acute posthemorrhagic anemia: Secondary | ICD-10-CM | POA: Diagnosis present

## 2015-12-05 DIAGNOSIS — I10 Essential (primary) hypertension: Secondary | ICD-10-CM | POA: Diagnosis present

## 2015-12-05 DIAGNOSIS — E7800 Pure hypercholesterolemia, unspecified: Secondary | ICD-10-CM | POA: Diagnosis present

## 2015-12-05 LAB — CBC WITH DIFFERENTIAL/PLATELET
BASOS ABS: 0.1 10*3/uL (ref 0–0.1)
Basophils Relative: 1 %
EOS ABS: 0 10*3/uL (ref 0–0.7)
EOS PCT: 0 %
HCT: 27.8 % — ABNORMAL LOW (ref 35.0–47.0)
Hemoglobin: 9.4 g/dL — ABNORMAL LOW (ref 12.0–16.0)
LYMPHS ABS: 1.5 10*3/uL (ref 1.0–3.6)
LYMPHS PCT: 14 %
MCH: 31.7 pg (ref 26.0–34.0)
MCHC: 33.8 g/dL (ref 32.0–36.0)
MCV: 93.8 fL (ref 80.0–100.0)
MONO ABS: 0.7 10*3/uL (ref 0.2–0.9)
Monocytes Relative: 7 %
Neutro Abs: 8.3 10*3/uL — ABNORMAL HIGH (ref 1.4–6.5)
Neutrophils Relative %: 78 %
PLATELETS: 315 10*3/uL (ref 150–440)
RBC: 2.96 MIL/uL — ABNORMAL LOW (ref 3.80–5.20)
RDW: 16.1 % — AB (ref 11.5–14.5)
WBC: 10.6 10*3/uL (ref 3.6–11.0)

## 2015-12-05 LAB — APTT: aPTT: 34 seconds (ref 24–36)

## 2015-12-05 LAB — PROTIME-INR
INR: 1.19
Prothrombin Time: 15.2 seconds (ref 11.4–15.2)

## 2015-12-05 MED ORDER — SODIUM CHLORIDE 0.9 % IV SOLN
10.0000 mL/h | Freq: Once | INTRAVENOUS | Status: AC
  Administered 2015-12-05: 10 mL/h via INTRAVENOUS

## 2015-12-05 MED ORDER — SODIUM CHLORIDE 0.9 % IV SOLN
80.0000 mg | Freq: Once | INTRAVENOUS | Status: AC
  Administered 2015-12-06: 80 mg via INTRAVENOUS
  Filled 2015-12-05: qty 80

## 2015-12-05 MED ORDER — OCTREOTIDE LOAD VIA INFUSION
50.0000 ug | Freq: Once | INTRAVENOUS | Status: AC
  Administered 2015-12-06: 50 ug via INTRAVENOUS
  Filled 2015-12-05: qty 25

## 2015-12-05 MED ORDER — SODIUM CHLORIDE 0.9 % IV SOLN
8.0000 mg/h | INTRAVENOUS | Status: DC
  Administered 2015-12-06 – 2015-12-08 (×6): 8 mg/h via INTRAVENOUS
  Filled 2015-12-05 (×7): qty 80

## 2015-12-05 MED ORDER — SODIUM CHLORIDE 0.9 % IV SOLN
50.0000 ug/h | INTRAVENOUS | Status: AC
  Administered 2015-12-06 – 2015-12-07 (×4): 50 ug/h via INTRAVENOUS
  Filled 2015-12-05 (×4): qty 1

## 2015-12-05 NOTE — ED Provider Notes (Signed)
[facility] Emergency Department Provider Note   ____________________________________________   First MD Initiated Contact with Patient 12/05/15 2219     (approximate)  I have reviewed the triage vital signs and the nursing notes.   HISTORY  Chief Complaint Rectal Bleeding    HPI Joann Huang is a 82 y.o. female with history of cirrhosis, hypertension, hyperlipidemia, recent hospitalization for upper GI bleed secondary to varices versus duodenal bleed who presents for evaluation of dark blood per rectum this evening, gradual onset, constant, severe, no modifying factors. No abdominal pain, no nausea or vomiting, no hematemesis. No chest pain or difficulty breathing. She is not chronically anticoagulated. She was just discharged from [facility] on 11/28/15 after treatment for upper GI bleed. Per EMS, on their arrival she was sitting in a large volume of melena.   Past Medical History:  Diagnosis Date  . Acute upper GI bleed 11/25/2015  . High cholesterol   . History of alcohol abuse    quit > 25years ago  . HTN (hypertension)    Not on any medications  . Hypertension   . Tobacco use     Patient Active Problem List   Diagnosis Date Noted  . Liver cirrhosis (HCC) 11/26/2015  . Fall   . Melena   . Acute blood loss anemia   . Duodenal ulcer 11/25/2015  . GI bleed 11/25/2015  . Leukocytosis 11/25/2015  . Elevated troponin I level 11/25/2015  . GERD (gastroesophageal reflux disease) 11/25/2015  . Weakness of both lower extremities 11/01/2015  . UTI (urinary tract infection) - with encephalopathy 11/01/2015  . Essential hypertension 11/01/2015  . Metabolic encephalopathy - related to UTI 11/01/2015  . NSTEMI (non-ST elevated myocardial infarction) (HCC) 10/31/2015    Past Surgical History:  Procedure Laterality Date  . APPENDECTOMY    . CHOLECYSTECTOMY    . ESOPHAGOGASTRODUODENOSCOPY N/A 11/26/2015   Procedure: ESOPHAGOGASTRODUODENOSCOPY (EGD);   Surgeon: Sherrilyn Rist, MD;  Location: [facility] ENDOSCOPY;  Service: Endoscopy;  Laterality: N/A;  . KNEE ARTHROSCOPY Left   . TONSILLECTOMY      Prior to Admission medications   Medication Sig Start Date End Date Taking? Authorizing Provider  acetaminophen (TYLENOL) 650 MG CR tablet Take 1,300 mg by mouth daily.    Historical Provider, MD  amLODipine (NORVASC) 5 MG tablet Take 1 tablet (5 mg total) by mouth daily. 11/03/15   Altamese Dilling, MD  atorvastatin (LIPITOR) 40 MG tablet Take 1 tablet (40 mg total) by mouth daily at 6 PM. 11/03/15   Altamese Dilling, MD  metoprolol succinate (TOPROL-XL) 50 MG 24 hr tablet Take 1 tablet (50 mg total) by mouth daily. Take with or immediately following a meal. 11/03/15   Altamese Dilling, MD  pantoprazole (PROTONIX) 40 MG tablet Take 1 tablet (40 mg total) by mouth 2 (two) times daily before a meal. 11/28/15   Clydia Llano, MD    Allergies Sulfa antibiotics and Penicillins  Family History  Problem Relation Age of Onset  . CAD Mother     Social History Social History  Substance Use Topics  . Smoking status: Former Smoker    Packs/day: 1.00    Years: 65.00    Types: Cigarettes    Quit date: 07/12/2015  . Smokeless tobacco: Never Used  . Alcohol use 0.0 oz/week     Comment: h/o heavy alcohol abuse; nothing since 07/2015 (11/25/2015)    Review of Systems Constitutional: No fever/chills Eyes: No visual changes. ENT: No sore throat. Cardiovascular:  Denies chest pain. Respiratory: Denies shortness of breath. Gastrointestinal: No abdominal pain.  No nausea, no vomiting.  No diarrhea.  No constipation. Genitourinary: Negative for dysuria. Musculoskeletal: Negative for back pain. Skin: Negative for rash. Neurological: Negative for headaches, focal weakness or numbness.  10-point ROS otherwise negative.  ____________________________________________   PHYSICAL EXAM:  Vitals:   12/05/15 2300 12/05/15 2325 12/05/15 2345 12/06/15  0005  BP: 116/76   124/78  Pulse:  (!) 104 (!) 101 96  Resp: (!) 21 17 (!) 24 (!) 21  Temp:      TempSrc:      SpO2:  96% 91% 96%  Weight:      Height:        VITAL SIGNS: ED Triage Vitals  Enc Vitals Group     BP 12/05/15 2215 119/78     Pulse Rate 12/05/15 2215 (!) 101     Resp 12/05/15 2215 (!) 24     Temp 12/05/15 2215 97.6 F (36.4 C)     Temp Source 12/05/15 2215 Oral     SpO2 12/05/15 2215 98 %     Weight 12/05/15 2217 169 lb (76.7 kg)     Height 12/05/15 2217 5\' 6"  (1.676 m)     Head Circumference --      Peak Flow --      Pain Score --      Pain Loc --      Pain Edu? --      Excl. in GC? --     Constitutional: Alert and oriented. Appears fatigued but nontoxic,  in no acute distress. Eyes: Conjunctivae are normal. PERRL. EOMI. Head: Atraumatic. Nose: No congestion/rhinnorhea. Mouth/Throat: Mucous membranes are moist.  Oropharynx non-erythematous. Neck: No stridor.  Supple without meningismus. Cardiovascular: Tachycardic rate, regular rhythm. Grossly normal heart sounds.  Good peripheral circulation. Respiratory: Normal respiratory effort.  No retractions. Lungs CTAB. Gastrointestinal: Soft and nontender. No distention.  No CVA tenderness. Genitourinary: Deferred Rectal: maroon Melena on the rectal vault is strongly guaiac positive. Musculoskeletal: No lower extremity tenderness nor edema.  No joint effusions. Neurologic:  Normal speech and language. No gross focal neurologic deficits are appreciated.  Skin:  Skin is warm, dry and intact. No rash noted. Psychiatric: Mood and affect are normal. Speech and behavior are normal.  ____________________________________________   LABS (all labs ordered are listed, but only abnormal results are displayed)  Labs Reviewed  CBC WITH DIFFERENTIAL/PLATELET - Abnormal; Notable for the following:       Result Value   RBC 2.96 (*)    Hemoglobin 9.4 (*)    HCT 27.8 (*)    RDW 16.1 (*)    Neutro Abs 8.3 (*)    All  other components within normal limits  PROTIME-INR  APTT  COMPREHENSIVE METABOLIC PANEL  TROPONIN I  TYPE AND SCREEN  PREPARE RBC (CROSSMATCH)   ____________________________________________  EKG  ED ECG REPORT I, Gayla Doss, the attending physician, personally viewed and interpreted this ECG.   Date: 12/05/2015  EKG Time: 22:20  Rate: 129  Rhythm: sinus tachycardia  Axis: left  Intervals:none  ST&T Change: No acute ST elevation or acute ST depression. Prolonged QTC at 512 ms.  ____________________________________________  RADIOLOGY  CXR - pending ____________________________________________   PROCEDURES  Procedure(s) performed: None  Procedures  Critical Care performed: No  ____________________________________________   INITIAL IMPRESSION / ASSESSMENT AND PLAN / ED COURSE  Pertinent labs & imaging results that were available during my care of the patient were  reviewed by me and considered in my medical decision making (see chart for details).  Joann Huang is a 82 y.o. female with history of cirrhosis, hypertension, hyperlipidemia, recent hospitalization for upper GI bleed secondary to varices versus duodenal bleed who presents for evaluation of dark blood per rectum this evening. On arrival to the emergency department she is pale appearing, tachycardic with a heart rate of 120 bpm my initial assessment, maintaining adequate blood pressure. She has a diaper full of melena concerning for ongoing/active upper GI bleed. Given her tachycardia and acute blood loss, she and I discussed risks and benefits of transfusion for symptomatic anemia. She voices understanding of risks and benefits and wants to proceed with transfusion, will transfuse 2 units of packed red blood cells. Will start octreotide and protonix boluses and drips, obtain screening labs and anticipate admission.  ----------------------------------------- 12:18 AM on  12/06/2015 ----------------------------------------- Patient with improvement of her heart rate at this time. Hemoglobin 9.4. Troponin and CMP pending. Chest x-ray also pending at time of admission. Case discussed with the hospitalist for admission at this time.   Clinical Course     ____________________________________________   FINAL CLINICAL IMPRESSION(S) / ED DIAGNOSES  Final diagnoses:  Acute GI bleeding  Gastrointestinal hemorrhage with melena  Acute upper GI bleed  Symptomatic anemia      NEW MEDICATIONS STARTED DURING THIS VISIT:  New Prescriptions   No medications on file     Note:  This document was prepared using Dragon voice recognition software and may include unintentional dictation errors.    Gayla DossEryka A Saleen Peden, MD 12/06/15 [phone]

## 2015-12-05 NOTE — ED Notes (Signed)
Pts daughter called and voicemail left.

## 2015-12-05 NOTE — ED Triage Notes (Signed)
Pt bib EMS from [facility] w/ c/o rectal bleeding.  Pt arrives alert, pale, denying CP, SOB, n/v/d or dizziness.  Per EMS, pts bleed started 2030, w/ copious amounts of dark/bright red blood w/ clots.

## 2015-12-05 NOTE — H&P (Signed)
[facility] [place] [facility]   PATIENT NAME: Joann Huang    MR#:  [id number]  DATE OF BIRTH:  [birth year]  DATE OF ADMISSION:  12/05/2015  PRIMARY CARE PHYSICIAN: No PCP Per Patient   REQUESTING/REFERRING PHYSICIAN: Inocencio Homes, MD  CHIEF COMPLAINT:   Chief Complaint  Patient presents with  . Rectal Bleeding    HISTORY OF PRESENT ILLNESS:  Joann Huang  is a 82 y.o. female who presents with 2 days of melena. Patient states that she had a GI bleed last month, which resulted in the same way. She had an endoscopy done at that time which she says showed an ulcer. She is not sure what treatment if any was performed at that time for that ulcer. She developed melena again 2 days ago, and states that she has had an unknown number of stool since that time, but "several" melanotic stools. Despite this her hemoglobin is relatively stable from her prior recent values in our system. However, given some of her associated symptoms, including fatigue and intermittent lightheadedness, ED physician ordered packed red blood cell transfusion for her in the ED. Hospitals were then called for admission.  PAST MEDICAL HISTORY:   Past Medical History:  Diagnosis Date  . Acute upper GI bleed 11/25/2015  . High cholesterol   . History of alcohol abuse    quit > 25years ago  . HTN (hypertension)    Not on any medications  . Hypertension   . Tobacco use     PAST SURGICAL HISTORY:   Past Surgical History:  Procedure Laterality Date  . APPENDECTOMY    . CHOLECYSTECTOMY    . ESOPHAGOGASTRODUODENOSCOPY N/A 11/26/2015   Procedure: ESOPHAGOGASTRODUODENOSCOPY (EGD);  Surgeon: Sherrilyn Rist, MD;  Location: [facility] ENDOSCOPY;  Service: Endoscopy;  Laterality: N/A;  . KNEE ARTHROSCOPY Left   . TONSILLECTOMY      SOCIAL HISTORY:   Social History  Substance Use Topics  . Smoking status: Former Smoker    Packs/day: 1.00    Years: 65.00    Types: Cigarettes    Quit date:  07/12/2015  . Smokeless tobacco: Never Used  . Alcohol use 0.0 oz/week     Comment: h/o heavy alcohol abuse; nothing since 07/2015 (11/25/2015)    FAMILY HISTORY:   Family History  Problem Relation Age of Onset  . CAD Mother     DRUG ALLERGIES:   Allergies  Allergen Reactions  . Sulfa Antibiotics Hives and Swelling  . Penicillins Hives and Other (See Comments)    Has patient had a PCN reaction causing immediate rash, facial/tongue/throat swelling, SOB or lightheadedness with hypotension: yes Has patient had a PCN reaction causing severe rash involving mucus membranes or skin necrosis: No Has patient had a PCN reaction that required hospitalization No Has patient had a PCN reaction occurring within the last 10 years: No If all of the above answers are "NO", then may proceed with Cephalosporin use.    MEDICATIONS AT HOME:   Prior to Admission medications   Medication Sig Start Date End Date Taking? Authorizing Provider  acetaminophen (TYLENOL) 650 MG CR tablet Take 1,300 mg by mouth daily.    Historical Provider, MD  amLODipine (NORVASC) 5 MG tablet Take 1 tablet (5 mg total) by mouth daily. 11/03/15   Altamese Dilling, MD  atorvastatin (LIPITOR) 40 MG tablet Take 1 tablet (40 mg total) by mouth daily at 6 PM. 11/03/15   Altamese Dilling, MD  metoprolol succinate (TOPROL-XL) 50 MG  24 hr tablet Take 1 tablet (50 mg total) by mouth daily. Take with or immediately following a meal. 11/03/15   Altamese Dilling, MD  pantoprazole (PROTONIX) 40 MG tablet Take 1 tablet (40 mg total) by mouth 2 (two) times daily before a meal. 11/28/15   Clydia Llano, MD    REVIEW OF SYSTEMS:  Review of Systems  Constitutional: Negative for chills, fever, malaise/fatigue and weight loss.  HENT: Negative for ear pain, hearing loss and tinnitus.   Eyes: Negative for blurred vision, double vision, pain and redness.  Respiratory: Negative for cough, hemoptysis and shortness of breath.    Cardiovascular: Negative for chest pain, palpitations, orthopnea and leg swelling.  Gastrointestinal: Positive for melena. Negative for abdominal pain, constipation, diarrhea, nausea and vomiting.  Genitourinary: Negative for dysuria, frequency and hematuria.  Musculoskeletal: Negative for back pain, joint pain and neck pain.  Skin:       No acne, rash, or lesions  Neurological: Negative for dizziness, tremors, focal weakness and weakness.  Endo/Heme/Allergies: Negative for polydipsia. Does not bruise/bleed easily.  Psychiatric/Behavioral: Negative for depression. The patient is not nervous/anxious and does not have insomnia.      VITAL SIGNS:   Vitals:   12/05/15 2236 12/05/15 2253 12/05/15 2300 12/05/15 2325  BP: 93/79 113/86 116/76   Pulse: (!) 103 (!) 102  (!) 104  Resp: (!) 24 (!) 26 (!) 21 17  Temp: 97.6 F (36.4 C) 97.6 F (36.4 C)    TempSrc: Oral Oral    SpO2: 98% 97%  96%  Weight:      Height:       Wt Readings from Last 3 Encounters:  12/05/15 76.7 kg (169 lb)  11/28/15 76.9 kg (169 lb 8.5 oz)  11/25/15 75.8 kg (167 lb)    PHYSICAL EXAMINATION:  Physical Exam  LABORATORY PANEL:   CBC  Recent Labs Lab 12/05/15 2214  WBC 10.6  HGB 9.4*  HCT 27.8*  PLT 315   ------------------------------------------------------------------------------------------------------------------  Chemistries  No results for input(s): NA, K, CL, CO2, GLUCOSE, BUN, CREATININE, CALCIUM, MG, AST, ALT, ALKPHOS, BILITOT in the last 168 hours.  Invalid input(s): GFRCGP ------------------------------------------------------------------------------------------------------------------  Cardiac Enzymes No results for input(s): TROPONINI in the last 168 hours. ------------------------------------------------------------------------------------------------------------------  RADIOLOGY:  No results found.  EKG:   Orders placed or performed during the hospital encounter of 12/05/15   . ED EKG  . ED EKG  . EKG 12-Lead  . EKG 12-Lead    IMPRESSION AND PLAN:  Principal Problem:   GI bleed - octreotide drip given the patient's history of cirrhosis, pantoprazole drip, PRBC transfusion given in the ED, we'll keep the patient nothing by mouth for now, give IV Rocephin given her history of cirrhosis, and get a GI consult Active Problems:   Elevated troponin - per records the patient has recently had elevated troponin values higher than that of tonight. However, we will trend her cardiac enzymes tonight. She denies any chest pain.   Essential hypertension - currently stable, continue home meds   GERD (gastroesophageal reflux disease) - PPI drip as above   Liver cirrhosis (HCC) - avoid hepatotoxins  All the records are reviewed and case discussed with ED provider. Management plans discussed with the patient and/or family.  DVT PROPHYLAXIS: Mechanical only  GI PROPHYLAXIS: PPI  ADMISSION STATUS: Inpatient  CODE STATUS: Full Code Status History    Date Active Date Inactive Code Status Order ID Comments User Context   11/25/2015  8:18 PM 11/29/2015  2:23  AM Full Code [id number]  Briscoe Deutscherimothy S Opyd, MD Inpatient   10/31/2015  8:27 PM 11/03/2015  9:05 PM Full Code [id number]  Enid Baasadhika Kalisetti, MD Inpatient      TOTAL TIME TAKING CARE OF THIS PATIENT: 45 minutes.    Joann Huang 12/05/2015, 11:34 PM  [facility]  Office  [phone]  CC: Primary care physician; No PCP Per Patient

## 2015-12-06 LAB — COMPREHENSIVE METABOLIC PANEL
ALT: 17 U/L (ref 14–54)
AST: 32 U/L (ref 15–41)
Albumin: 2.5 g/dL — ABNORMAL LOW (ref 3.5–5.0)
Alkaline Phosphatase: 94 U/L (ref 38–126)
Anion gap: 11 (ref 5–15)
BUN: 34 mg/dL — ABNORMAL HIGH (ref 6–20)
CHLORIDE: 107 mmol/L (ref 101–111)
CO2: 22 mmol/L (ref 22–32)
CREATININE: 0.8 mg/dL (ref 0.44–1.00)
Calcium: 8 mg/dL — ABNORMAL LOW (ref 8.9–10.3)
Glucose, Bld: 134 mg/dL — ABNORMAL HIGH (ref 65–99)
POTASSIUM: 4.1 mmol/L (ref 3.5–5.1)
Sodium: 140 mmol/L (ref 135–145)
TOTAL PROTEIN: 5.2 g/dL — AB (ref 6.5–8.1)
Total Bilirubin: 0.6 mg/dL (ref 0.3–1.2)

## 2015-12-06 LAB — AMMONIA: AMMONIA: 10 umol/L (ref 9–35)

## 2015-12-06 LAB — CBC
HCT: 30 % — ABNORMAL LOW (ref 35.0–47.0)
Hemoglobin: 10.3 g/dL — ABNORMAL LOW (ref 12.0–16.0)
MCH: 31.2 pg (ref 26.0–34.0)
MCHC: 34.5 g/dL (ref 32.0–36.0)
MCV: 90.5 fL (ref 80.0–100.0)
PLATELETS: 250 10*3/uL (ref 150–440)
RBC: 3.31 MIL/uL — AB (ref 3.80–5.20)
RDW: 16.4 % — AB (ref 11.5–14.5)
WBC: 11.3 10*3/uL — AB (ref 3.6–11.0)

## 2015-12-06 LAB — TYPE AND SCREEN
ABO/RH(D): O POS
ANTIBODY SCREEN: NEGATIVE
UNIT DIVISION: 0
Unit division: 0

## 2015-12-06 LAB — BASIC METABOLIC PANEL
Anion gap: 7 (ref 5–15)
BUN: 33 mg/dL — AB (ref 6–20)
CO2: 24 mmol/L (ref 22–32)
CREATININE: 0.63 mg/dL (ref 0.44–1.00)
Calcium: 7.5 mg/dL — ABNORMAL LOW (ref 8.9–10.3)
Chloride: 108 mmol/L (ref 101–111)
Glucose, Bld: 117 mg/dL — ABNORMAL HIGH (ref 65–99)
POTASSIUM: 3.5 mmol/L (ref 3.5–5.1)
SODIUM: 139 mmol/L (ref 135–145)

## 2015-12-06 LAB — TROPONIN I
TROPONIN I: 0.06 ng/mL — AB (ref <0.03)
Troponin I: 0.05 ng/mL (ref <0.03)
Troponin I: 0.04 ng/mL (ref <0.03)

## 2015-12-06 LAB — PREPARE RBC (CROSSMATCH)

## 2015-12-06 LAB — HEMOGLOBIN: Hemoglobin: 10 g/dL — ABNORMAL LOW (ref 12.0–16.0)

## 2015-12-06 MED ORDER — ONDANSETRON HCL 4 MG PO TABS: 4.0000 mg | ORAL_TABLET | Freq: Four times a day (QID) | ORAL | Status: DC | PRN

## 2015-12-06 MED ORDER — ONDANSETRON HCL 4 MG/2ML IJ SOLN: 4.0000 mg | Freq: Four times a day (QID) | INTRAMUSCULAR | Status: DC | PRN

## 2015-12-06 MED ORDER — DEXTROSE 5 % IV SOLN
2.0000 g | INTRAVENOUS | Status: DC
  Administered 2015-12-07: 2 g via INTRAVENOUS
  Filled 2015-12-06: qty 2

## 2015-12-06 MED ORDER — SUCRALFATE 1 GM/10ML PO SUSP
1.0000 g | Freq: Three times a day (TID) | ORAL | Status: DC
  Administered 2015-12-06 – 2015-12-10 (×12): 1 g via ORAL
  Filled 2015-12-06 (×15): qty 10

## 2015-12-06 MED ORDER — DEXTROSE 5 % IV SOLN
2.0000 g | Freq: Once | INTRAVENOUS | Status: AC
  Administered 2015-12-06: 2 g via INTRAVENOUS
  Filled 2015-12-06: qty 2

## 2015-12-06 MED ORDER — SODIUM CHLORIDE 0.9% FLUSH
3.0000 mL | Freq: Two times a day (BID) | INTRAVENOUS | Status: DC
  Administered 2015-12-06 – 2015-12-10 (×7): 3 mL via INTRAVENOUS

## 2015-12-06 NOTE — ED Notes (Signed)
MD Anne HahnWillis informed of pts elevated troponin.  Pt to be admitted to telemetry

## 2015-12-06 NOTE — ED Notes (Signed)
MD at bedside. 

## 2015-12-06 NOTE — Progress Notes (Signed)
[facility] [place] at [facility]   PATIENT NAME: Joann Huang    MR#:  [id number]  DATE OF BIRTH:  [birth year]  SUBJECTIVE:  CHIEF COMPLAINT:   Chief Complaint  Patient presents with  . Rectal Bleeding   The patient is confused and demented. No complaint. REVIEW OF SYSTEMS:  Review of Systems  Unable to perform ROS: Mental acuity    DRUG ALLERGIES:   Allergies  Allergen Reactions  . Sulfa Antibiotics Hives and Swelling  . Penicillins Hives and Other (See Comments)    Has patient had a PCN reaction causing immediate rash, facial/tongue/throat swelling, SOB or lightheadedness with hypotension: yes Has patient had a PCN reaction causing severe rash involving mucus membranes or skin necrosis: No Has patient had a PCN reaction that required hospitalization No Has patient had a PCN reaction occurring within the last 10 years: No If all of the above answers are "NO", then may proceed with Cephalosporin use.   VITALS:  Blood pressure 102/65, pulse 99, temperature 98.2 F (36.8 C), temperature source Oral, resp. rate 20, height 5\' 6"  (1.676 m), weight 154 lb 11.2 oz (70.2 kg), SpO2 97 %. PHYSICAL EXAMINATION:  Physical Exam  Constitutional: She is well-developed, well-nourished, and in no distress. No distress.  HENT:  Head: Normocephalic and atraumatic.  Mouth/Throat: Oropharynx is clear and moist.  Eyes: Conjunctivae and EOM are normal. Pupils are equal, round, and reactive to light.  Neck: Normal range of motion. Neck supple. No JVD present. No thyromegaly present.  Cardiovascular: Normal rate, regular rhythm and normal heart sounds.  Exam reveals no gallop.   No murmur heard. Pulmonary/Chest: Effort normal and breath sounds normal. No respiratory distress. She has no wheezes. She has no rales.  Abdominal: She exhibits no distension. There is no tenderness.  Musculoskeletal: Normal range of motion. She exhibits no edema or tenderness.  Lymphadenopathy:      She has no cervical adenopathy.  Neurological: She is alert.  Confused, not follow commands  Skin: Skin is warm.   LABORATORY PANEL:   CBC  Recent Labs Lab 12/06/15 0214 12/06/15 0740  WBC 11.3*  --   HGB 10.3* 10.0*  HCT 30.0*  --   PLT 250  --    ------------------------------------------------------------------------------------------------------------------ Chemistries   Recent Labs Lab 12/05/15 2214 12/06/15 0214  NA 140 139  K 4.1 3.5  CL 107 108  CO2 22 24  GLUCOSE 134* 117*  BUN 34* 33*  CREATININE 0.80 0.63  CALCIUM 8.0* 7.5*  AST 32  --   ALT 17  --   ALKPHOS 94  --   BILITOT 0.6  --    RADIOLOGY:  Dg Chest Portable 1 View  Result Date: 12/06/2015 CLINICAL DATA:  Passing bright red blood per rectum.  Tachycardia. EXAM: PORTABLE CHEST 1 VIEW COMPARISON:  10/31/2015 FINDINGS: Emphysematous changes in the lungs with scattered fibrosis. Normal heart size and pulmonary vascularity. No focal airspace disease or consolidation in the lungs. No blunting of costophrenic angles. No pneumothorax. Mediastinal contours appear intact. Calcification of the aorta. IMPRESSION: Prominent emphysematous changes in the lungs. No evidence of active pulmonary disease. Electronically Signed   By: Burman Nieves M.D.   On: 12/06/2015 00:17   ASSESSMENT AND PLAN:   Anemia of acute blood loss due to GI bleeding,  continue octreotide and protonix drip given the patient's history of cirrhosis and PUD. pantoprazole drip, PRBC transfusion given in the ED. Start clear liquid diet, continue IV Rocephin given her  history of cirrhosis. Add carafate and f/u Hb, no intervention this time per Dr. Markham JordanElliot, GI consult    Elevated troponin - per records the patient has recently had elevated troponin values higher than that of tonight. However, we will trend her cardiac enzymes tonight. She denies any chest pain.   Essential hypertension - currently stable, continue home meds   GERD  (gastroesophageal reflux disease) - PPI drip as above   Liver cirrhosis (HCC) - avoid hepatotoxins   All the records are reviewed and case discussed with Care Management/Social Worker. Management plans discussed with the patient, family and they are in agreement.  CODE STATUS: Full code.  TOTAL TIME TAKING CARE OF THIS PATIENT: 35 minutes.   More than 50% of the time was spent in counseling/coordination of care: YES  POSSIBLE D/C IN 2 DAYS, DEPENDING ON CLINICAL CONDITION.   Shaune Pollackhen, Arlan Birks M.D on 12/06/2015 at 3:30 PM  Between 7am to 6pm - Pager - [phone]  After 6pm go to [email or web address] - Scientist, research (life [email or web address])password [email or web address]  [facility] [place] [facility]  Office  [phone]  CC: Primary care physician; No PCP Per Patient  Note: This dictation was prepared with Dragon dictation along with smaller phrase technology. Any transcriptional errors that result from this process are unintentional.

## 2015-12-06 NOTE — Consult Note (Signed)
Patient with some degree of encephalopathy/lethargy.  Her hgb is stable for last 24 hours.  She had [facility] very [facility] with dark pigmented material on ulcer base.  Abd soft non tender, no HSM.  Pt currently on nothing by mouth.  She is on octreotide and PPI infusions.  I do not think she is actively bleeding at this point and will follow her with you.  See note from PA [facility] CenterMichelle Johnson.

## 2015-12-06 NOTE — Consult Note (Signed)
GI Inpatient Consult Note  Reason for Consult: Melena    Attending Requesting Consult: Dr. Anne Hahn  History of Present Illness: Joann Huang is a 82 y.o. female with a known history of EtOH cirrhosis, gastric and duodenal ulcer (on EGD in 10/2015), GERD, HTN and h/o and STEMI admitted with melena.  Patient states she was in her usual state of health when she began noticing black tarry stools last week.  She recalls undergoing an EGD, but does not remember what was found.  She denies taking any stomach medications at home, and does not remember being discharged with a medication. She denies GI symptoms like epigastric pain, nausea, vomiting, or change in bowel habits.  Also no fatigue, weakness, dizziness, CP, or SOB.  History of EtOH abuse also noted, though patient does not speak to this.  Patient denies NSAID use.  Her weight is stable.  Appetite is good.  She denies additional GI concerns like dysphagia, change in bowel habits, or frank blood in stool.  Review of patient's chart indicates she does not take any blood thinning medications, but several large, purple bruises are noted on her body.  She does not remember what brought her into the ED.  She also states she has not had anything to eat or drink since last week.  Review of provider notes indicated Hgb returned at 9.4 in the ED.  BUN was also mildly elevated at 34.  She received 1 unit PRBCs and improved to 10.3.  WBCs are mildly elevated today at 11.3.  Notably, Hgb was 9.4 at time of d/c from [facility].  The hospitalist service was called for admission given recurrence of melena with a recent ulcer and symptomatic anemia. She was started on Protonix and Octreotide drips, given h/o recent ulcer and cirrhosis, respectively.  GI consult was requested for further management.  Review of patient's endoscopy performed at [facility] on 11/26/15 was notable for the following: - 5 mm gastric ulcer on the greater curvature of the antrum - Large  cratered duodenal ulcer with pigmented material in the duodenal bulb.  There is associated edema and luminal narrowing. - One 2 mm angiodysplastic lesion on the anterior wall of the gastric body - Medium-sized hiatal hernia - Pathology negative for H pylori infection  As noted above, patient was discharged on Protonix 40 mg twice a day on 11/28/15.  However, it is not clear if she is taking this.  Past Medical History:  Past Medical History:  Diagnosis Date  . Acute upper GI bleed 11/25/2015  . High cholesterol   . History of alcohol abuse    quit > 25years ago  . HTN (hypertension)    Not on any medications  . Hypertension   . Tobacco use     Problem List: Patient Active Problem List   Diagnosis Date Noted  . Liver cirrhosis (HCC) 11/26/2015  . Fall   . Melena   . Acute blood loss anemia   . Duodenal ulcer 11/25/2015  . GI bleed 11/25/2015  . Leukocytosis 11/25/2015  . Elevated troponin 11/25/2015  . GERD (gastroesophageal reflux disease) 11/25/2015  . Weakness of both lower extremities 11/01/2015  . UTI (urinary tract infection) - with encephalopathy 11/01/2015  . Essential hypertension 11/01/2015  . Metabolic encephalopathy - related to UTI 11/01/2015  . NSTEMI (non-ST elevated myocardial infarction) (HCC) 10/31/2015    Past Surgical History: Past Surgical History:  Procedure Laterality Date  . APPENDECTOMY    . CHOLECYSTECTOMY    .  ESOPHAGOGASTRODUODENOSCOPY N/A 11/26/2015   Procedure: ESOPHAGOGASTRODUODENOSCOPY (EGD);  Surgeon: Sherrilyn Rist, MD;  Location: [facility] ENDOSCOPY;  Service: Endoscopy;  Laterality: N/A;  . KNEE ARTHROSCOPY Left   . TONSILLECTOMY      Allergies: Allergies  Allergen Reactions  . Sulfa Antibiotics Hives and Swelling  . Penicillins Hives and Other (See Comments)    Has patient had a PCN reaction causing immediate rash, facial/tongue/throat swelling, SOB or lightheadedness with hypotension: yes Has patient had a PCN reaction causing severe  rash involving mucus membranes or skin necrosis: No Has patient had a PCN reaction that required hospitalization No Has patient had a PCN reaction occurring within the last 10 years: No If all of the above answers are "NO", then may proceed with Cephalosporin use.    Home Medications: Prescriptions Prior to Admission  Medication Sig Dispense Refill Last Dose  . acetaminophen (TYLENOL) 650 MG CR tablet Take 1,300 mg by mouth daily.   11/25/2015 at Unknown time  . amLODipine (NORVASC) 5 MG tablet Take 1 tablet (5 mg total) by mouth daily. 30 tablet 0 11/25/2015 at Unknown time  . atorvastatin (LIPITOR) 40 MG tablet Take 1 tablet (40 mg total) by mouth daily at 6 PM. 30 tablet 0 11/24/2015 at Unknown time  . metoprolol succinate (TOPROL-XL) 50 MG 24 hr tablet Take 1 tablet (50 mg total) by mouth daily. Take with or immediately following a meal. 30 tablet 0 11/24/2015 at Unknown time  . pantoprazole (PROTONIX) 40 MG tablet Take 1 tablet (40 mg total) by mouth 2 (two) times daily before a meal.      Home medication reconciliation was completed with the patient.   Scheduled Inpatient Medications:   . [START ON 12/07/2015] cefTRIAXone (ROCEPHIN)  IV  2 g Intravenous Q24H  . sodium chloride flush  3 mL Intravenous Q12H    Continuous Inpatient Infusions:   . octreotide  (SANDOSTATIN)    IV infusion 50 mcg/hr (12/06/15 0252)  . pantoprozole (PROTONIX) infusion 8 mg/hr (12/06/15 0306)    PRN Inpatient Medications:  ondansetron **OR** ondansetron (ZOFRAN) IV  Family History: family history includes CAD in her mother.   Social History:   reports that she quit smoking about 4 months ago. Her smoking use included Cigarettes. She has a 65.00 pack-year smoking history. She has never used smokeless tobacco. She reports that she drinks alcohol. She reports that she does not use drugs.   Review of Systems: Constitutional: Weight is stable.  Eyes: No changes in vision. ENT: No oral lesions, sore  throat.  GI: see HPI.  Heme/Lymph: No easy bruising.  CV: No chest pain.  GU: No hematuria.  Integumentary: No rashes.  Neuro: No headaches.  Psych: No depression/anxiety.  Endocrine: No heat/cold intolerance.  Allergic/Immunologic: No urticaria.  Resp: No cough, SOB.  Musculoskeletal: No joint swelling.    Physical Examination: BP (!) 115/57 (BP Location: Left Arm)   Pulse 97   Temp 98.3 F (36.8 C) (Oral)   Resp 20   Ht 5\' 6"  (1.676 m)   Wt 70.2 kg (154 lb 11.2 oz)   SpO2 95%   BMI 24.97 kg/m  Gen: NAD, alert and oriented to place and time of day HEENT: PEERLA, EOMI, Neck: supple, no JVD or thyromegaly Chest: CTA bilaterally, no wheezes, crackles, or other adventitious sounds CV: RRR, no m/g/c/r Abd: soft, NT, ND, +BS in all four quadrants; no HSM, guarding, ridigity, or rebound tenderness Ext: no edema, well perfused with 2+ pulses, Skin: large  areas of purple ecchymosis noted on R forearm and R flank, no rash or lesions noted Lymph: no LAD  Data: Lab Results  Component Value Date   WBC 11.3 (H) 12/06/2015   HGB 10.0 (L) 12/06/2015   HCT 30.0 (L) 12/06/2015   MCV 90.5 12/06/2015   PLT 250 12/06/2015    Recent Labs Lab 12/05/15 2214 12/06/15 0214 12/06/15 0740  HGB 9.4* 10.3* 10.0*   Lab Results  Component Value Date   NA 139 12/06/2015   K 3.5 12/06/2015   CL 108 12/06/2015   CO2 24 12/06/2015   BUN 33 (H) 12/06/2015   CREATININE 0.63 12/06/2015   Lab Results  Component Value Date   ALT 17 12/05/2015   AST 32 12/05/2015   ALKPHOS 94 12/05/2015   BILITOT 0.6 12/05/2015    Recent Labs Lab 12/05/15 2214  APTT 34  INR 1.19   Assessment/Plan: Ms. Thurston HoleKruse is a [age] y.o. female with a known history of EtOH cirrhosis, gastric an duodenal ulcer (on EGD in 10/2015), GERD, HTN and h/o and STEMI admitted with melena.  Patient had to large ulcers on EGD last week, as well as an angioectasia.  Recurrent melena is likely due to continued bleeding or oozing of  ulcers, resulting in symptomatic anemia.  Patient appears disoriented and is a poor historian at this time.  She has difficulty answering open ended questions, and answers pointed questions with abnormal responses (i.e. Stating she has not had anything to eat or drink since last week).  I discussed this with patient's CMA, who notes no change in mental status from baseline on arrival.  Given her h/o cirrhosis, patient may be experiencing encephalopathy.  She is not actively bleeding, so will continue to monitor at this time.  Also, I suspect she did not adhere to PPI therapy BID as prescribed for ulcers.  Recommendations: - Continue monitoring Hgb, transfuse if <7 - Continue IV Protonix & IV Octreotide drips - Start Carafate slurry TID - STAT ammonia pending  - Further recs pending patient progress  Thank you for the consult. We will follow along with you. Please call with questions or concerns.  Burman FreestoneMichelle C Johnson, PA-C [facility] Phone: [phone] Pager: [phone]

## 2015-12-06 NOTE — Progress Notes (Signed)
Pharmacy Antibiotic Note  Joann Huang is a 82 y.o. female admitted on 12/05/2015 with cirrhosis.  Pharmacy has been consulted for ceftriaxone dosing.  Plan: Ceftriaxone 2 grams q 24 hours ordered.  Height: 5\' 6"  (167.6 cm) Weight: 169 lb (76.7 kg) IBW/kg (Calculated) : 59.3  Temp (24hrs), Avg:97.6 F (36.4 C), Min:97.6 F (36.4 C), Max:97.7 F (36.5 C)   Recent Labs Lab 12/05/15 2214  WBC 10.6  CREATININE 0.80    Estimated Creatinine Clearance: 55.8 mL/min (by C-G formula based on SCr of 0.8 mg/dL).    Allergies  Allergen Reactions  . Sulfa Antibiotics Hives and Swelling  . Penicillins Hives and Other (See Comments)    Has patient had a PCN reaction causing immediate rash, facial/tongue/throat swelling, SOB or lightheadedness with hypotension: yes Has patient had a PCN reaction causing severe rash involving mucus membranes or skin necrosis: No Has patient had a PCN reaction that required hospitalization No Has patient had a PCN reaction occurring within the last 10 years: No If all of the above answers are "NO", then may proceed with Cephalosporin use.   Confirmed with hospitalist OK to proceed with Rocephin.  Antimicrobials this admission: ceftriaxone  >>    >>   Dose adjustments this admission:   Microbiology results:  8/17 MRSA PCR: (-)  Thank you for allowing pharmacy to be a part of this patient's care.  Saniyyah Elster S 12/06/2015 1:59 AM

## 2015-12-07 LAB — HEMOGLOBIN
HEMOGLOBIN: 8.5 g/dL — AB (ref 12.0–16.0)
HEMOGLOBIN: 8.7 g/dL — AB (ref 12.0–16.0)

## 2015-12-07 NOTE — Progress Notes (Signed)
[employer] - [place] at [facility]   PATIENT NAME: Joann Huang    MR#:  [id number]  DATE OF BIRTH:  09-03-1930  SUBJECTIVE:  CHIEF COMPLAINT:   Chief Complaint  Patient presents with  . Rectal Bleeding   The patient is demented. No complaint. REVIEW OF SYSTEMS:  Review of Systems  Unable to perform ROS: Mental acuity    DRUG ALLERGIES:   Allergies  Allergen Reactions  . Sulfa Antibiotics Hives and Swelling  . Penicillins Hives and Other (See Comments)    Has patient had a PCN reaction causing immediate rash, facial/tongue/throat swelling, SOB or lightheadedness with hypotension: yes Has patient had a PCN reaction causing severe rash involving mucus membranes or skin necrosis: No Has patient had a PCN reaction that required hospitalization No Has patient had a PCN reaction occurring within the last 10 years: No If all of the above answers are "NO", then may proceed with Cephalosporin use.   VITALS:  Blood pressure 113/75, pulse 87, temperature 98.2 F (36.8 C), temperature source Oral, resp. rate (!) 24, height 5\' 6"  (1.676 m), weight 154 lb 11.2 oz (70.2 kg), SpO2 91 %. PHYSICAL EXAMINATION:  Physical Exam  Constitutional: She is well-developed, well-nourished, and in no distress. No distress.  HENT:  Head: Normocephalic and atraumatic.  Mouth/Throat: Oropharynx is clear and moist.  Eyes: Conjunctivae and EOM are normal. Pupils are equal, round, and reactive to light.  Neck: Normal range of motion. Neck supple. No JVD present. No thyromegaly present.  Cardiovascular: Normal rate, regular rhythm and normal heart sounds.  Exam reveals no gallop.   No murmur heard. Pulmonary/Chest: Effort normal and breath sounds normal. No respiratory distress. She has no wheezes. She has no rales.  Abdominal: She exhibits no distension. There is no tenderness.  Musculoskeletal: Normal range of motion. She exhibits no edema or tenderness.  Lymphadenopathy:    She  has no cervical adenopathy.  Neurological: She is alert.  Confused, not follow commands  Skin: Skin is warm.   LABORATORY PANEL:   CBC  Recent Labs Lab 12/06/15 0214  12/07/15 1149  WBC 11.3*  --   --   HGB 10.3*  < > 8.7*  HCT 30.0*  --   --   PLT 250  --   --   < > = values in this interval not displayed. ------------------------------------------------------------------------------------------------------------------ Chemistries   Recent Labs Lab 12/05/15 2214 12/06/15 0214  NA 140 139  K 4.1 3.5  CL 107 108  CO2 22 24  GLUCOSE 134* 117*  BUN 34* 33*  CREATININE 0.80 0.63  CALCIUM 8.0* 7.5*  AST 32  --   ALT 17  --   ALKPHOS 94  --   BILITOT 0.6  --    RADIOLOGY:  No results found. ASSESSMENT AND PLAN:   Anemia of acute blood loss due to GI bleeding, Hb down to 8.5, repeat 8.7. continue octreotide and protonix drip given the patient's history of cirrhosis and large PUD. PRBC transfusion given in the ED. Advance to full liquid diet for 4-5 days due to size of the large duodenal ulcer per Dr. Markham JordanElliot. discontinue IV Rocephin. Added carafate, no intervention this time per Dr. Markham JordanElliot.  f/u Hb.    Elevated troponin - per records the patient has recently had elevated troponin.    Essential hypertension - currently stable, continue home meds   GERD (gastroesophageal reflux disease) - PPI drip as above   Liver cirrhosis (HCC) - avoid hepatotoxins  I discussed with Dr. Markham Jordan. All the records are reviewed and case discussed with Care Management/Social Worker. Management plans discussed with the patient, family and they are in agreement.  CODE STATUS: Full code.  TOTAL TIME TAKING CARE OF THIS PATIENT: 39 minutes.   More than 50% of the time was spent in counseling/coordination of care: YES  POSSIBLE D/C IN 3 DAYS, DEPENDING ON CLINICAL CONDITION.   Shaune Pollack M.D on 12/07/2015 at 1:21 PM  Between 7am to 6pm - Pager - [phone]  After 6pm go to  [email or web address] - Scientist, research (life sciences) [facility]  Office  [phone]  CC: Primary care physician; No PCP Per Patient  Note: This dictation was prepared with Dragon dictation along with smaller phrase technology. Any transcriptional errors that result from this process are unintentional.

## 2015-12-07 NOTE — Consult Note (Signed)
Patient awake taking clear liquid diet well.  Will advance to full liquid diet.  With her normal ammonia level I thinks some of her problem could be dementia.  Can try full liquid diet but stay with that for 4-5 days due to size of the large duodenal ulcer.  Hgb 8.5 VSS afebrile.

## 2015-12-08 LAB — HEMOGLOBIN: Hemoglobin: 8.9 g/dL — ABNORMAL LOW (ref 12.0–16.0)

## 2015-12-08 MED ORDER — AMLODIPINE BESYLATE 5 MG PO TABS
5.0000 mg | ORAL_TABLET | Freq: Every day | ORAL | Status: DC
  Administered 2015-12-08 – 2015-12-10 (×3): 5 mg via ORAL
  Filled 2015-12-08 (×3): qty 1

## 2015-12-08 MED ORDER — METOPROLOL SUCCINATE ER 50 MG PO TB24
50.0000 mg | ORAL_TABLET | Freq: Every day | ORAL | Status: DC
  Administered 2015-12-08 – 2015-12-10 (×3): 50 mg via ORAL
  Filled 2015-12-08 (×3): qty 1

## 2015-12-08 MED ORDER — ATORVASTATIN CALCIUM 20 MG PO TABS
40.0000 mg | ORAL_TABLET | Freq: Every day | ORAL | Status: DC
Start: 2015-12-08 — End: 2015-12-10
  Administered 2015-12-08 – 2015-12-09 (×2): 40 mg via ORAL
  Filled 2015-12-08 (×2): qty 2

## 2015-12-08 MED ORDER — PANTOPRAZOLE SODIUM 40 MG PO TBEC
40.0000 mg | DELAYED_RELEASE_TABLET | Freq: Two times a day (BID) | ORAL | Status: DC
  Administered 2015-12-08 – 2015-12-10 (×4): 40 mg via ORAL
  Filled 2015-12-08 (×4): qty 1

## 2015-12-08 NOTE — Progress Notes (Addendum)
[employer] - [place] at [facility]   PATIENT NAME: Joann Huang    MR#:  [id number]  DATE OF BIRTH:  [birth year]  SUBJECTIVE:  CHIEF COMPLAINT:   Chief Complaint  Patient presents with  . Rectal Bleeding   The patient is demented. No complaint. No BM. REVIEW OF SYSTEMS:  Review of Systems  Unable to perform ROS: Mental acuity    DRUG ALLERGIES:   Allergies  Allergen Reactions  . Sulfa Antibiotics Hives and Swelling  . Penicillins Hives and Other (See Comments)    Has patient had a PCN reaction causing immediate rash, facial/tongue/throat swelling, SOB or lightheadedness with hypotension: yes Has patient had a PCN reaction causing severe rash involving mucus membranes or skin necrosis: No Has patient had a PCN reaction that required hospitalization No Has patient had a PCN reaction occurring within the last 10 years: No If all of the above answers are "NO", then may proceed with Cephalosporin use.   VITALS:  Blood pressure (!) 146/69, pulse 72, temperature 98 F (36.7 C), temperature source Oral, resp. rate 16, height 5\' 6"  (1.676 m), weight 154 lb 11.2 oz (70.2 kg), SpO2 94 %. PHYSICAL EXAMINATION:  Physical Exam  Constitutional: She is well-developed, well-nourished, and in no distress. No distress.  HENT:  Head: Normocephalic and atraumatic.  Mouth/Throat: Oropharynx is clear and moist.  Eyes: Conjunctivae and EOM are normal. Pupils are equal, round, and reactive to light.  Neck: Normal range of motion. Neck supple. No JVD present. No thyromegaly present.  Cardiovascular: Normal rate, regular rhythm and normal heart sounds.  Exam reveals no gallop.   No murmur heard. Pulmonary/Chest: Effort normal and breath sounds normal. No respiratory distress. She has no wheezes. She has no rales.  Abdominal: She exhibits no distension. There is no tenderness.  Musculoskeletal: Normal range of motion. She exhibits no edema or tenderness.  Lymphadenopathy:   She has no cervical adenopathy.  Neurological: She is alert. No cranial nerve deficit.  Skin: Skin is warm.   LABORATORY PANEL:   CBC  Recent Labs Lab 12/06/15 0214  12/08/15 0441  WBC 11.3*  --   --   HGB 10.3*  < > 8.9*  HCT 30.0*  --   --   PLT 250  --   --   < > = values in this interval not displayed. ------------------------------------------------------------------------------------------------------------------ Chemistries   Recent Labs Lab 12/05/15 2214 12/06/15 0214  NA 140 139  K 4.1 3.5  CL 107 108  CO2 22 24  GLUCOSE 134* 117*  BUN 34* 33*  CREATININE 0.80 0.63  CALCIUM 8.0* 7.5*  AST 32  --   ALT 17  --   ALKPHOS 94  --   BILITOT 0.6  --    RADIOLOGY:  No results found. ASSESSMENT AND PLAN:   Anemia of acute blood loss due to GI bleeding, Hb down to 8.5, repeat 8.7, 8.9 today. No BM. He was given octreotide and protonix drip given the patient's history of cirrhosis and large PUD. PRBC transfusion given in the ED. Advanced to full liquid diet for 4-5 days due to size of the large duodenal ulcer per Dr. Markham Jordan. discontinued IV Rocephin.  Added carafate, no intervention this time per Dr. Markham Jordan.  f/u Hb. Off octreotide drip, change to po protonix.    Elevated troponin - per records the patient has recently had elevated troponin.    Essential hypertension - currently stable, continue home meds   GERD (gastroesophageal reflux disease) -  PPI drip as above   Liver cirrhosis (HCC) - avoid hepatotoxins  All the records are reviewed and case discussed with Care Management/Social Worker. Management plans discussed with the patient, family and they are in agreement.  CODE STATUS: Full code.  TOTAL TIME TAKING CARE OF THIS PATIENT: 35 minutes.   More than 50% of the time was spent in counseling/coordination of care: YES  POSSIBLE D/C to SNF IN 2-3 DAYS, DEPENDING ON CLINICAL CONDITION.   Shaune Pollackhen, Joann Huang M.D on 12/08/2015 at 2:26 PM  Between 7am to 6pm -  Pager - [phone]  After 6pm go to [email or web address] - Scientist, research (life sciences)password [email or web address] [email or web address]  [facility]  Office  [phone]  CC: Primary care physician; No PCP Per Patient  Note: This dictation was prepared with Dragon dictation along with smaller phrase technology. Any transcriptional errors that result from this process are unintentional.

## 2015-12-08 NOTE — Progress Notes (Signed)
Patient transferring to 1A. Dr. Imogene Burnhen rounding in room now and patient is eating breakfast. Will transfer as soon as she finishes. Report called to [facility] RN.

## 2015-12-08 NOTE — Consult Note (Signed)
Patient requesting some more food than current diet.  She is supposed to be on full liquid diet but it is more like a clear liquid diet.  I spoke to her nurse and will start ice cream and pudding.  Hgb down but stable.  No other new suggestions.

## 2015-12-08 NOTE — NC FL2 (Signed)
[place] MEDICAID FL2 LEVEL OF CARE SCREENING TOOL     IDENTIFICATION  Patient Name: Joann Huang Birthdate: [birth year] Sex: female Admission Date (Current Location): 12/05/2015  [place] and [place] Number:  Chiropodist and Address:  [facility], [address] [place], [place] [address]      Provider Number: [id number]  Attending Physician Name and Address:  Shaune Pollack, MD  Relative Name and Phone Number:       Current Level of Care: Hospital Recommended Level of Care: Skilled Nursing Facility Prior Approval Number:    Date Approved/Denied:   PASRR Number:  ( [id number] )  Discharge Plan: SNF    Current Diagnoses: Patient Active Problem List   Diagnosis Date Noted  . Liver cirrhosis (HCC) 11/26/2015  . Fall   . Melena   . Acute blood loss anemia   . Duodenal ulcer 11/25/2015  . GI bleed 11/25/2015  . Leukocytosis 11/25/2015  . Elevated troponin 11/25/2015  . GERD (gastroesophageal reflux disease) 11/25/2015  . Weakness of both lower extremities 11/01/2015  . UTI (urinary tract infection) - with encephalopathy 11/01/2015  . Essential hypertension 11/01/2015  . Metabolic encephalopathy - related to UTI 11/01/2015  . NSTEMI (non-ST elevated myocardial infarction) (HCC) 10/31/2015    Orientation RESPIRATION BLADDER Height & Weight     Self, Place  Normal Incontinent Weight: 154 lb 11.2 oz (70.2 kg) Height:  5\' 6"  (167.6 cm)  BEHAVIORAL SYMPTOMS/MOOD NEUROLOGICAL BOWEL NUTRITION STATUS   (none)  (none) Continent Diet (Diet: Full Liquid )  AMBULATORY STATUS COMMUNICATION OF NEEDS Skin   Extensive Assist Verbally Normal                       Personal Care Assistance Level of Assistance  Bathing, Feeding, Dressing Bathing Assistance: Limited assistance Feeding assistance: Independent Dressing Assistance: Limited assistance     Functional Limitations Info  Sight, Hearing, Speech Sight Info: Adequate Hearing Info:  Adequate Speech Info: Adequate    SPECIAL CARE FACTORS FREQUENCY  PT (By licensed PT), OT (By licensed OT)     PT Frequency:  (5) OT Frequency:  (5)            Contractures      Additional Factors Info  Code Status, Allergies Code Status Info:  (Full Code. ) Allergies Info:  (Sulfa Antibiotics, Penicillins)           Current Medications (12/08/2015):  This is the current hospital active medication list Current Facility-Administered Medications  Medication Dose Route Frequency Provider Last Rate Last Dose  . amLODipine (NORVASC) tablet 5 mg  5 mg Oral Daily Shaune Pollack, MD   5 mg at 12/08/15 0856  . atorvastatin (LIPITOR) tablet 40 mg  40 mg Oral q1800 Shaune Pollack, MD      . metoprolol succinate (TOPROL-XL) 24 hr tablet 50 mg  50 mg Oral Q breakfast Shaune Pollack, MD   50 mg at 12/08/15 0856  . ondansetron (ZOFRAN) tablet 4 mg  4 mg Oral Q6H PRN Oralia Manis, MD       Or  . ondansetron [facility]) injection 4 mg  4 mg Intravenous Q6H PRN Oralia Manis, MD      . pantoprazole (PROTONIX) 80 mg in sodium chloride 0.9 % 250 mL (0.32 mg/mL) infusion  8 mg/hr Intravenous Continuous Gayla Doss, MD 25 mL/hr at 12/08/15 1318 8 mg/hr at 12/08/15 1318  . sodium chloride flush (NS) 0.9 % injection 3 mL  3 mL Intravenous Q12H Oralia Manisavid Willis, MD   3 mL at 12/08/15 1000  . sucralfate (CARAFATE) 1 GM/10ML suspension 1 g  1 g Oral TID WC & HS Burman FreestoneMichelle C Johnson, PA-C   1 g at 12/08/15 1314     Discharge Medications: Please see discharge summary for a list of discharge medications.  Relevant Imaging Results:  Relevant Lab Results:   Additional Information  (SSN: [id number])  Latiffany Harwick, Darleen CrockerBailey M, LCSW

## 2015-12-08 NOTE — Progress Notes (Signed)
Pt. Slept throughout the night. Pt continues to be incontinent of urine. No stools during the night. No c/o pain, SOB or acute distress noted. Will continue to monitor pt.

## 2015-12-08 NOTE — Clinical Social Work Note (Signed)
Clinical Social Work Assessment  Patient Details  Name: Joann Huang MRN: [id number] Date of Birth: 18-Dec-1930  Date of referral:  12/08/15               Reason for consult:  Other (Comment Required) (From [facility] SNF )                Permission sought to share information with:  Chartered certified accountant granted to share information::  Yes, Verbal Permission Granted  Name::      Pharmacologist::   [place]   Relationship::     Contact Information:     Housing/Transportation Living arrangements for the past 2 months:  [place], [place] of Information:  Patient, Adult Children Patient Interpreter Needed:  None Criminal Activity/Legal Involvement Pertinent to Current Situation/Hospitalization:  No - Comment as needed Significant Relationships:  Adult Children Lives with:  Facility Resident Do you feel safe going back to the place where you live?  Yes Need for family participation in patient care:  Yes (Comment)  Care giving concerns:  Patient is from [facility] for short term rehab and is in the process of transitioning to long term care.    Social Worker assessment / plan:  Holiday representative ([place]) reviewed chart and noted that patient is from [place]. Per [facility] admissions coordinator at [facility] patient is there for short term rehab with the intention to transition to long term care. Per Liliane Channel patient can return to [place]. CSW met with patient to discuss D/C plan. Patient was pleasant and oriented to self and place. Per patient she is from [place] and is agreeable to returning. Per patient she lived in [place] before moving to [place]. Patient gave CSW permission to call her daughter Joann Huang. CSW contacted patient's daughter Joann Huang to complete assessment. Per daughter patient is confused and lived in [place] 6 years ago. Per daughter she wants patient to return to [facility] and they are spending down so  patient can qualify for long term care Medicaid.   FL2 complete and faxed out. CSW will continue to follow and assist as needed.   Employment status:  Retired Forensic scientist:  Medicare PT Recommendations:  Not assessed at this time [place] / Referral to community resources:  [place]  Patient/Family's Response to care:  Patient and daughter are agreeable for patient to return to [employer].   Patient/Family's Understanding of and Emotional Response to Diagnosis, Current Treatment, and Prognosis:  Patient and daughter were pleasant and thanked CSW for visit.   Emotional Assessment Appearance:  Appears stated age Attitude/Demeanor/Rapport:    Affect (typically observed):  Accepting, Adaptable, Pleasant Orientation:  Oriented to Self, Oriented to Place, Fluctuating Orientation (Suspected and/or reported Sundowners) Alcohol / Substance use:  Not Applicable Psych involvement (Current and /or in the community):  No (Comment)  Discharge Needs  Concerns to be addressed:  Discharge Planning Concerns Readmission within the last 30 days:  No Current discharge risk:  Dependent with Mobility Barriers to Discharge:  Continued Medical Work up   [employer], Veronia Beets, LCSW 12/08/2015, 2:01 PM

## 2015-12-08 NOTE — Care Management Important Message (Signed)
Important Message  Patient Details  Name: Joann Huang MRN: [id number] Date of Birth: February 28, 1931   Medicare Important Message Given:       Marily MemosLisa M Arliene Rosenow, RN 12/08/2015, 9:16 AM

## 2015-12-09 LAB — HEMOGLOBIN: Hemoglobin: 9.5 g/dL — ABNORMAL LOW (ref 12.0–16.0)

## 2015-12-09 MED ORDER — BISACODYL 5 MG PO TBEC
5.0000 mg | DELAYED_RELEASE_TABLET | Freq: Every day | ORAL | Status: DC | PRN
  Administered 2015-12-09 – 2015-12-10 (×2): 5 mg via ORAL
  Filled 2015-12-09 (×2): qty 1

## 2015-12-09 MED ORDER — DOCUSATE SODIUM 100 MG PO CAPS
100.0000 mg | ORAL_CAPSULE | Freq: Two times a day (BID) | ORAL | Status: DC
  Administered 2015-12-09 – 2015-12-10 (×3): 100 mg via ORAL
  Filled 2015-12-09 (×3): qty 1

## 2015-12-09 NOTE — Progress Notes (Signed)
A & O. Poor PO intake. Room air. Takes meds ok. NO BM since 8/15- Po meds given. Pt has not reported any pain. Pt has no further concerns at this time.

## 2015-12-09 NOTE — Consult Note (Signed)
Patient requesting a hamburger.  Hgb stable over last few days.  No vomiting.  VSS.  Will advance diet tomorrow to mechanical soft.  Color looks good.

## 2015-12-09 NOTE — Evaluation (Signed)
Physical Therapy Evaluation Patient Details Name: Joann Huang MRN: [id number] DOB: 10/07/1930 Today's Date: 12/09/2015   History of Present Illness  presented to ER secondary to 2 days of melena, fatigue and dizziness; admitted with symptomatic anemia related to GIB.  current HgB 9.5  Clinical Impression  Patient generally agitated with limited participation/cooperation with evaluation attempts despite max encouragement from therapist.  Demonstrates strength at least 3/5 throughout bilat LEs (able to actively elevate over edge of bed with sit to supine); refuses participation with other formal assessment.  Requiring mod assist for bed mobility; dep with attempts to scoot towards edge of bed.  With attempts to assist with scooting to edge of bed, patient with increased agitation, squeezing therapist arm and resisting all movement attempts.  Refused all additional mobility/assessment attempts and spontaneously returned self to bed.  Will continue mobility assessment as patient allows. Would benefit from skilled PT to address above deficits and promote optimal return to PLOF; recommend transition to STR upon discharge from acute hospitalization, pending ability to actively participate/progress.     Follow Up Recommendations SNF (if able to actively participate/progress with skilled interventions)    Equipment Recommendations       Recommendations for Other Services       Precautions / Restrictions Precautions Precautions: Fall Restrictions Weight Bearing Restrictions: No      Mobility  Bed Mobility Overal bed mobility: Needs Assistance Bed Mobility: Supine to Sit;Sit to Supine     Supine to sit: Mod assist Sit to supine: Min assist      Transfers                 General transfer comment: patient refused.  With attempts to assist with scooting to edge of bed, patient with increased agitation, squeezing therapist arm and resisting all moevment  attempts.  Ambulation/Gait             General Gait Details: patient refused  Stairs            Wheelchair Mobility    Modified Rankin (Stroke Patients Only)       Balance Overall balance assessment: Needs assistance Sitting-balance support: No upper extremity supported;Feet supported Sitting balance-Leahy Scale: Poor   Postural control: Posterior lean     Standing balance comment: refused                             Pertinent Vitals/Pain Pain Assessment: Faces Faces Pain Scale: No hurt    Home Living Family/patient expects to be discharged to:: Skilled nursing facility                 Additional Comments: Patient was at [facility] for rehab prior to admission; plans to return upon discharge per chart    Prior Function Level of Independence: Needs assistance         Comments: Per patient, was participating with rehab at [facility], though patient unwilling to elaborate on activities/abilities in therapy.  will verify with family as available.     Hand Dominance        Extremity/Trunk Assessment   Upper Extremity Assessment: Generalized weakness           Lower Extremity Assessment: Generalized weakness (grossly at least 3/5 throughout; difficult to formally assess seconadry to patient agitation)         Communication   Communication: No difficulties  Cognition Arousal/Alertness: Awake/alert Behavior During Therapy: Agitated Overall Cognitive Status: Difficult to assess (limited  participation/cooperation with evaluation)                      General Comments      Exercises        Assessment/Plan    PT Assessment Patient needs continued PT services  PT Diagnosis Altered mental status;Generalized weakness;Difficulty walking   PT Problem List Decreased strength;Decreased activity tolerance;Decreased balance;Decreased mobility;Decreased cognition;Decreased knowledge of use of DME;Decreased safety  awareness;Decreased knowledge of precautions  PT Treatment Interventions DME instruction;Gait training;Functional mobility training;Therapeutic activities;Therapeutic exercise;Balance training;Neuromuscular re-education;Patient/family education   PT Goals (Current goals can be found in the Care Plan section) Acute Rehab PT Goals PT Goal Formulation: Patient unable to participate in goal setting Time For Goal Achievement: 12/23/15 Potential to Achieve Goals: Fair Additional Goals Additional Goal #1: Assess and establish goals for OOB activities as appropriate    Frequency Min 2X/week   Barriers to discharge Decreased caregiver support      Co-evaluation               End of Session   Activity Tolerance: Treatment limited secondary to agitation Patient left: in bed;with call bell/phone within reach;with bed alarm set           Time: 1100-1109 PT Time Calculation (min) (ACUTE ONLY): 9 min   Charges:   PT Evaluation $PT Eval Low Complexity: 1 Procedure     PT G Codes:       Jadene Stemmer H. Manson PasseyBrown, PT, DPT, NCS 12/09/15, 11:10 AM [phone]

## 2015-12-09 NOTE — Plan of Care (Signed)
Problem: Nutrition: Goal: Adequate nutrition will be maintained Outcome: Progressing Pt encouraged to eat meals

## 2015-12-09 NOTE — Progress Notes (Signed)
[facility] - [place] at [facility]   PATIENT NAME: Joann Huang    MR#:  [id number]  DATE OF BIRTH:  [birth year]  SUBJECTIVE:  CHIEF COMPLAINT:   Chief Complaint  Patient presents with  . Rectal Bleeding   The patient is demented. No complaint. Still No BM. REVIEW OF SYSTEMS:  Review of Systems  Unable to perform ROS: Mental acuity    DRUG ALLERGIES:   Allergies  Allergen Reactions  . Sulfa Antibiotics Hives and Swelling  . Penicillins Hives and Other (See Comments)    Has patient had a PCN reaction causing immediate rash, facial/tongue/throat swelling, SOB or lightheadedness with hypotension: yes Has patient had a PCN reaction causing severe rash involving mucus membranes or skin necrosis: No Has patient had a PCN reaction that required hospitalization No Has patient had a PCN reaction occurring within the last 10 years: No If all of the above answers are "NO", then may proceed with Cephalosporin use.   VITALS:  Blood pressure (!) 150/67, pulse 69, temperature 98.2 F (36.8 C), temperature source Oral, resp. rate 19, height 5\' 6"  (1.676 m), weight 154 lb 11.2 oz (70.2 kg), SpO2 90 %. PHYSICAL EXAMINATION:  Physical Exam  Constitutional: She is well-developed, well-nourished, and in no distress. No distress.  HENT:  Head: Normocephalic and atraumatic.  Mouth/Throat: Oropharynx is clear and moist.  Eyes: Conjunctivae and EOM are normal. Pupils are equal, round, and reactive to light.  Neck: Normal range of motion. Neck supple. No JVD present. No thyromegaly present.  Cardiovascular: Normal rate, regular rhythm and normal heart sounds.  Exam reveals no gallop.   No murmur heard. Pulmonary/Chest: Effort normal and breath sounds normal. No respiratory distress. She has no wheezes. She has no rales.  Abdominal: She exhibits no distension. There is no tenderness.  Musculoskeletal: Normal range of motion. She exhibits no edema or tenderness.    Lymphadenopathy:    She has no cervical adenopathy.  Neurological: She is alert. No cranial nerve deficit.  Skin: Skin is warm.   LABORATORY PANEL:   CBC  Recent Labs Lab 12/06/15 0214  12/09/15 0432  WBC 11.3*  --   --   HGB 10.3*  < > 9.5*  HCT 30.0*  --   --   PLT 250  --   --   < > = values in this interval not displayed. ------------------------------------------------------------------------------------------------------------------ Chemistries   Recent Labs Lab 12/05/15 2214 12/06/15 0214  NA 140 139  K 4.1 3.5  CL 107 108  CO2 22 24  GLUCOSE 134* 117*  BUN 34* 33*  CREATININE 0.80 0.63  CALCIUM 8.0* 7.5*  AST 32  --   ALT 17  --   ALKPHOS 94  --   BILITOT 0.6  --    RADIOLOGY:  No results found. ASSESSMENT AND PLAN:   Anemia of acute blood loss due to GI bleeding, Hb down to 8.5, repeat 8.7, 9.5 today. No BM. He was given octreotide and protonix drip given the patient's history of cirrhosis and large PUD. PRBC transfusion given in the ED. Advanced to full liquid diet for 4-5 days due to size of the large duodenal ulcer per Dr. Markham JordanElliot. discontinued IV Rocephin.  Added carafate, no intervention this time per Dr. Markham JordanElliot. Off octreotide drip, changed to po protonix.    Elevated troponin - per records the patient has recently had elevated troponin.    Essential hypertension - currently stable, continue home meds   GERD (gastroesophageal reflux  disease) - PPI drip as above   Liver cirrhosis (HCC) - avoid hepatotoxins  All the records are reviewed and case discussed with Care Management/Social Worker. Management plans discussed with the patient, family and they are in agreement.  CODE STATUS: Full code.  TOTAL TIME TAKING CARE OF THIS PATIENT: 27 minutes.   More than 50% of the time was spent in counseling/coordination of care: YES  POSSIBLE D/C to SNF IN 1-2 DAYS, DEPENDING ON CLINICAL CONDITION.   Shaune Pollack M.D on 12/09/2015 at 2:43 PM  Between  7am to 6pm - Pager - [phone]  After 6pm go to [email or web address] - Scientist, research (life sciences) [facility]  Office  [phone]  CC: Primary care physician; No PCP Per Patient  Note: This dictation was prepared with Dragon dictation along with smaller phrase technology. Any transcriptional errors that result from this process are unintentional.

## 2015-12-09 NOTE — Progress Notes (Signed)
Per MD patient is not medically stable for D/C today. Plan is for [facility] to [facility] when medically cleared.   [employer] Hughes IncorporatedBailey Kallie Depolo, LCSW [phone]

## 2015-12-09 NOTE — Plan of Care (Signed)
Problem: Safety: Goal: Ability to remain free from injury will improve Outcome: Progressing Bed alarm on   

## 2015-12-10 MED ORDER — SUCRALFATE 1 GM/10ML PO SUSP: 1.0000 g | Freq: Three times a day (TID) | ORAL | 0 refills | Status: AC

## 2015-12-10 MED ORDER — DOCUSATE SODIUM 100 MG PO CAPS: 100.0000 mg | ORAL_CAPSULE | Freq: Two times a day (BID) | ORAL | 0 refills | Status: AC

## 2015-12-10 MED ORDER — LACTULOSE 10 GM/15ML PO SOLN
10.0000 g | Freq: Two times a day (BID) | ORAL | Status: DC | PRN
  Administered 2015-12-10: 10 g via ORAL
  Filled 2015-12-10: qty 30

## 2015-12-10 MED ORDER — BISACODYL 5 MG PO TBEC: 5.0000 mg | DELAYED_RELEASE_TABLET | Freq: Every day | ORAL | 0 refills | Status: AC | PRN

## 2015-12-10 NOTE — Plan of Care (Signed)
Problem: Pain Managment: Goal: General experience of comfort will improve Outcome: Completed/Met Date Met: 12/10/15 No complaints of pain  Problem: Fluid Volume: Goal: Will show no signs and symptoms of excessive bleeding Outcome: Completed/Met Date Met: 12/10/15 No signs or symptoms of gi bleeding.

## 2015-12-10 NOTE — Discharge Summary (Signed)
[facility] - [place] at [facility]   PATIENT NAME: Joann Huang    MR#:  [id number]  DATE OF BIRTH:  1930/04/16  DATE OF ADMISSION:  12/05/2015   ADMITTING PHYSICIAN: Oralia Manis, MD  DATE OF DISCHARGE: 12/10/2015 PRIMARY CARE PHYSICIAN: No PCP Per Patient   ADMISSION DIAGNOSIS:  Acute GI bleeding [K92.2] Acute upper GI bleed [K92.2] Gastrointestinal hemorrhage with melena [K92.1] Symptomatic anemia [D64.9] DISCHARGE DIAGNOSIS:  Principal Problem:   GI bleed Active Problems:   Essential hypertension   Elevated troponin   GERD (gastroesophageal reflux disease)   Liver cirrhosis (HCC) anemia  SECONDARY DIAGNOSIS:   Past Medical History:  Diagnosis Date  . Acute upper GI bleed 11/25/2015  . High cholesterol   . History of alcohol abuse    quit > 25years ago  . HTN (hypertension)    Not on any medications  . Hypertension   . Tobacco use    HOSPITAL COURSE:   Anemia of acute blood loss due to GI bleeding, Hb down to 8.5, repeat up to 9.5. No BM. Hb is stable. He was given octreotide and protonix drip given the patient's history of cirrhosis and large PUD. PRBC transfusion given in the ED. Advanced to full liquid diet for 4-5 days due to size of the large duodenal ulcer per Dr. Markham Jordan. discontinued IV Rocephin.  Added carafate, no intervention this time per Dr. Markham Jordan. Off octreotide drip, changed to po protonix. Changed to soft diet per Dr. Markham Jordan.  Elevated troponin - per records the patient has recently had elevated troponin.  Essential hypertension - currently stable, continue home meds GERD (gastroesophageal reflux disease) - PPI Liver cirrhosis (HCC) - avoid hepatotoxins  DISCHARGE CONDITIONS:  Stable, discharge to SNF today. CONSULTS OBTAINED:  Treatment Team:  Scot Jun, MD DRUG ALLERGIES:   Allergies  Allergen Reactions  . Sulfa Antibiotics Hives and Swelling  . Penicillins Hives and Other (See Comments)    Has  patient had a PCN reaction causing immediate rash, facial/tongue/throat swelling, SOB or lightheadedness with hypotension: yes Has patient had a PCN reaction causing severe rash involving mucus membranes or skin necrosis: No Has patient had a PCN reaction that required hospitalization No Has patient had a PCN reaction occurring within the last 10 years: No If all of the above answers are "NO", then may proceed with Cephalosporin use.   DISCHARGE MEDICATIONS:     Medication List    TAKE these medications   acetaminophen 650 MG CR tablet Commonly known as:  TYLENOL Take 1,300 mg by mouth daily.   amLODipine 5 MG tablet Commonly known as:  NORVASC Take 1 tablet (5 mg total) by mouth daily.   atorvastatin 40 MG tablet Commonly known as:  LIPITOR Take 1 tablet (40 mg total) by mouth daily at 6 PM.   bisacodyl 5 MG EC tablet Commonly known as:  DULCOLAX Take 1 tablet (5 mg total) by mouth daily as needed for moderate constipation.   docusate sodium 100 MG capsule Commonly known as:  COLACE Take 1 capsule (100 mg total) by mouth 2 (two) times daily.   metoprolol succinate 50 MG 24 hr tablet Commonly known as:  TOPROL-XL Take 1 tablet (50 mg total) by mouth daily. Take with or immediately following a meal.   pantoprazole 40 MG tablet Commonly known as:  PROTONIX Take 1 tablet (40 mg total) by mouth 2 (two) times daily before a meal.   sucralfate 1 GM/10ML suspension Commonly known as:  CARAFATE Take 10 mLs (1 g total) by mouth 4 (four) times daily -  with meals and at bedtime.        DISCHARGE INSTRUCTIONS:   DIET:  Mechanical soft diet DISCHARGE CONDITION:  Stable. ACTIVITY:  As tolerated. DISCHARGE LOCATION:    If you experience worsening of your admission symptoms, develop shortness of breath, life threatening emergency, suicidal or homicidal thoughts you must seek medical attention immediately by calling 911 or calling your MD immediately  if symptoms less  severe.  You Must read complete instructions/literature along with all the possible adverse reactions/side effects for all the Medicines you take and that have been prescribed to you. Take any new Medicines after you have completely understood and accpet all the possible adverse reactions/side effects.   Please note  You were cared for by a hospitalist during your hospital stay. If you have any questions about your discharge medications or the care you received while you were in the hospital after you are discharged, you can call the unit and asked to speak with the hospitalist on call if the hospitalist that took care of you is not available. Once you are discharged, your primary care physician will handle any further medical issues. Please note that NO REFILLS for any discharge medications will be authorized once you are discharged, as it is imperative that you return to your primary care physician (or establish a relationship with a primary care physician if you do not have one) for your aftercare needs so that they can reassess your need for medications and monitor your lab values.    On the day of Discharge:  VITAL SIGNS:  Blood pressure (!) 151/66, pulse 65, temperature 97.9 F (36.6 C), temperature source Oral, resp. rate 18, height 5\' 6"  (1.676 m), weight 154 lb 11.2 oz (70.2 kg), SpO2 97 %. PHYSICAL EXAMINATION:  GENERAL:  82 y.o.-year-old patient lying in the bed with no acute distress.  EYES: Pupils equal, round, reactive to light and accommodation. No scleral icterus. Extraocular muscles intact.  HEENT: Head atraumatic, normocephalic. Oropharynx and nasopharynx clear.  NECK:  Supple, no jugular venous distention. No thyroid enlargement, no tenderness.  LUNGS: Normal breath sounds bilaterally, no wheezing, rales,rhonchi or crepitation. No use of accessory muscles of respiration.  CARDIOVASCULAR: S1, S2 normal. No murmurs, rubs, or gallops.  ABDOMEN: Soft, non-tender, non-distended.  Bowel sounds present. No organomegaly or mass.  EXTREMITIES: No pedal edema, cyanosis, or clubbing.  NEUROLOGIC: Cranial nerves II through XII are intact. Muscle strength 4/5 in all extremities. Sensation intact. Gait not checked.  PSYCHIATRIC: The patient is alert and oriented x 3.  SKIN: No obvious rash, lesion, or ulcer.  DATA REVIEW:   CBC  Recent Labs Lab 12/06/15 0214  12/09/15 0432  WBC 11.3*  --   --   HGB 10.3*  < > 9.5*  HCT 30.0*  --   --   PLT 250  --   --   < > = values in this interval not displayed.  Chemistries   Recent Labs Lab 12/05/15 2214 12/06/15 0214  NA 140 139  K 4.1 3.5  CL 107 108  CO2 22 24  GLUCOSE 134* 117*  BUN 34* 33*  CREATININE 0.80 0.63  CALCIUM 8.0* 7.5*  AST 32  --   ALT 17  --   ALKPHOS 94  --   BILITOT 0.6  --      Microbiology Results  Results for orders placed or performed during the hospital encounter  of 11/25/15  MRSA PCR Screening     Status: None   Collection Time: 11/26/15  1:00 AM  Result Value Ref Range Status   MRSA by PCR NEGATIVE NEGATIVE Final    Comment:        The GeneXpert MRSA Assay (FDA approved for NASAL specimens only), is one component of a comprehensive MRSA colonization surveillance program. It is not intended to diagnose MRSA infection nor to guide or monitor treatment for MRSA infections.     RADIOLOGY:  No results found.   Management plans discussed with the patient, family and they are in agreement.  CODE STATUS:     Code Status Orders        Start     Ordered   12/06/15 0145  Full code  Continuous     12/06/15 0144    Code Status History    Date Active Date Inactive Code Status Order ID Comments User Context   11/25/2015  8:18 PM 11/29/2015  2:23 AM Full Code [id number]  Briscoe Deutscherimothy S Opyd, MD Inpatient   10/31/2015  8:27 PM 11/03/2015  9:05 PM Full Code [id number]  Enid Baasadhika Kalisetti, MD Inpatient      TOTAL TIME TAKING CARE OF THIS PATIENT: 33 minutes.    Shaune Pollackhen, Anuja Manka M.D on  12/10/2015 at 8:56 AM  Between 7am to 6pm - Pager - [phone]  After 6pm go to [email or web address] - Scientist, research (life sciences)password [email or web address]  [facility] [place] [facility]  Office  [phone]  CC: Primary care physician; No PCP Per Patient   Note: This dictation was prepared with Dragon dictation along with smaller phrase technology. Any transcriptional errors that result from this process are unintentional.

## 2015-12-10 NOTE — Care Management Note (Signed)
Case Management Note  Patient Details  Name: Zara Counciludrey J Minix MRN: [id number] Date of Birth: [birth year]  Subjective/Objective:                    Action/Plan: [facility] CSW following for placement, No CM needs. Sign off.   Expected Discharge Date:                  Expected Discharge Plan:  Skilled Nursing Facility  In-House Referral:     Discharge planning Services  CM Consult  Post Acute Care Choice:    Choice offered to:     DME Arranged:    DME Agency:     HH Arranged:    HH Agency:     Status of Service:  Completed, signed off  If discussed at [employer] Length of Stay Meetings, dates discussed:    Additional Comments:  Adonis HugueninBerkhead, Kamil Mchaffie L, RN 12/10/2015, 9:31 AM

## 2015-12-10 NOTE — Care Management Important Message (Signed)
Important Message  Patient Details  Name: Joann Huang MRN: [id number] Date of Birth: 09-Apr-1931   Medicare Important Message Given:  Yes    Adonis HugueninBerkhead, Kipper Buch L, RN 12/10/2015, 9:22 AM

## 2015-12-10 NOTE — Progress Notes (Signed)
Patient is medically stable for D/C back to [facility] today. [facility] to room A[facility]16. RN will call report and arrange EMS for transport. Clinical Child psychotherapistocial Worker (CSW) [facility] [employer]. Patient is aware of above. CSW contacted patient's daughter Belenda CruiseKristin and made her aware of above. Please reconsult if future social work needs arise. CSW signing off.   [employer]orporatedBailey Jerrett Baldinger, LCSW [phone]

## 2015-12-10 NOTE — Progress Notes (Signed)
Patient discharging to [facility]. Report called to Bill at facility. EMS called. Waiting on transport.

## 2015-12-10 NOTE — Discharge Instructions (Signed)
° °  Gastrointestinal Bleeding Gastrointestinal (GI) bleeding means there is bleeding somewhere along the digestive tract, between the mouth and anus. CAUSES  There are many different problems that can cause GI bleeding. Possible causes include:  Esophagitis. This is inflammation, irritation, or swelling of the esophagus.  Hemorrhoids.These are veins that are full of blood (engorged) in the rectum. They cause pain, inflammation, and may bleed.  Anal fissures.These are areas of painful tearing which may bleed. They are often caused by passing hard stool.  Diverticulosis.These are pouches that form on the colon over time, with age, and may bleed significantly.  Diverticulitis.This is inflammation in areas with diverticulosis. It can cause pain, fever, and bloody stools, although bleeding is rare.  Polyps and cancer. Colon cancer often starts out as precancerous polyps.  Gastritis and ulcers.Bleeding from the upper gastrointestinal tract (near the stomach) may travel through the intestines and produce black, sometimes tarry, often bad smelling stools. In certain cases, if the bleeding is fast enough, the stools may not be black, but red. This condition may be life-threatening. SYMPTOMS   Vomiting bright red blood or material that looks like coffee grounds.  Bloody, black, or tarry stools. DIAGNOSIS  Your caregiver may diagnose your condition by taking your history and performing a physical exam. More tests may be needed, including:  X-rays and other imaging tests.  Esophagogastroduodenoscopy (EGD). This test uses a flexible, lighted tube to look at your esophagus, stomach, and small intestine.  Colonoscopy. This test uses a flexible, lighted tube to look at your colon. TREATMENT  Treatment depends on the cause of your bleeding.   For bleeding from the esophagus, stomach, small intestine, or colon, the caregiver doing your EGD or colonoscopy may be able to stop the bleeding as part  of the procedure.  Inflammation or infection of the colon can be treated with medicines.  Many rectal problems can be treated with creams, suppositories, or warm baths.  Surgery is sometimes needed.  Blood transfusions are sometimes needed if you have lost a lot of blood. If bleeding is slow, you may be allowed to go home. If there is a lot of bleeding, you will need to stay in the hospital for observation. HOME CARE INSTRUCTIONS   Take any medicines exactly as prescribed.  Keep your stools soft by eating foods that are high in fiber. These foods include whole grains, legumes, fruits, and vegetables. Prunes (1 to 3 a day) work well for many people.  Drink enough fluids to keep your urine clear or pale yellow. SEEK IMMEDIATE MEDICAL CARE IF:   Your bleeding increases.  You feel lightheaded, weak, or you faint.  You have severe cramps in your back or abdomen.  You pass large blood clots in your stool.  Your problems are getting worse. MAKE SURE YOU:   Understand these instructions.  Will watch your condition.  Will get help right away if you are not doing well or get worse.   This information is not intended to replace advice given to you by your health care provider. Make sure you discuss any questions you have with your health care provider.   Document Released: 03/26/2000 Document Revised: 03/15/2012 Document Reviewed: 09/16/2014 Elsevier Interactive Patient Education 2016 [employer] Mechanical soft diet. Activity as tolerated. Fall and aspiration precaution.

## 2015-12-10 NOTE — Consult Note (Signed)
From a GI standpoint I agree with your plans and would keep her on PPI twice a day forever, avoid all NSAID, mechanical soft diet.

## 2016-01-05 ENCOUNTER — Ambulatory Visit: Payer: Medicare Other | Admitting: Cardiovascular Disease

## 2016-01-05 ENCOUNTER — Encounter: Payer: Self-pay | Admitting: *Deleted

## 2016-02-23 ENCOUNTER — Inpatient Hospital Stay
Admission: EM | Admit: 2016-02-23 | Discharge: 2016-03-12 | DRG: 871 | Disposition: E | Payer: Medicare Other | Attending: Pulmonary Disease | Admitting: Pulmonary Disease

## 2016-02-23 ENCOUNTER — Emergency Department: Payer: Medicare Other

## 2016-02-23 DIAGNOSIS — J449 Chronic obstructive pulmonary disease, unspecified: Secondary | ICD-10-CM | POA: Diagnosis present

## 2016-02-23 DIAGNOSIS — Z66 Do not resuscitate: Secondary | ICD-10-CM | POA: Diagnosis present

## 2016-02-23 DIAGNOSIS — L03313 Cellulitis of chest wall: Secondary | ICD-10-CM | POA: Diagnosis present

## 2016-02-23 DIAGNOSIS — R739 Hyperglycemia, unspecified: Secondary | ICD-10-CM | POA: Diagnosis present

## 2016-02-23 DIAGNOSIS — N179 Acute kidney failure, unspecified: Secondary | ICD-10-CM | POA: Diagnosis present

## 2016-02-23 DIAGNOSIS — K7031 Alcoholic cirrhosis of liver with ascites: Secondary | ICD-10-CM | POA: Diagnosis not present

## 2016-02-23 DIAGNOSIS — R6521 Severe sepsis with septic shock: Secondary | ICD-10-CM | POA: Diagnosis present

## 2016-02-23 DIAGNOSIS — D649 Anemia, unspecified: Secondary | ICD-10-CM | POA: Diagnosis present

## 2016-02-23 DIAGNOSIS — Z87891 Personal history of nicotine dependence: Secondary | ICD-10-CM | POA: Diagnosis not present

## 2016-02-23 DIAGNOSIS — I10 Essential (primary) hypertension: Secondary | ICD-10-CM | POA: Diagnosis present

## 2016-02-23 DIAGNOSIS — K7290 Hepatic failure, unspecified without coma: Secondary | ICD-10-CM | POA: Diagnosis present

## 2016-02-23 DIAGNOSIS — E872 Acidosis: Secondary | ICD-10-CM | POA: Diagnosis present

## 2016-02-23 DIAGNOSIS — Z8249 Family history of ischemic heart disease and other diseases of the circulatory system: Secondary | ICD-10-CM | POA: Diagnosis not present

## 2016-02-23 DIAGNOSIS — R627 Adult failure to thrive: Secondary | ICD-10-CM | POA: Diagnosis present

## 2016-02-23 DIAGNOSIS — Z79899 Other long term (current) drug therapy: Secondary | ICD-10-CM | POA: Diagnosis not present

## 2016-02-23 DIAGNOSIS — G9341 Metabolic encephalopathy: Secondary | ICD-10-CM | POA: Diagnosis present

## 2016-02-23 DIAGNOSIS — D684 Acquired coagulation factor deficiency: Secondary | ICD-10-CM | POA: Diagnosis present

## 2016-02-23 DIAGNOSIS — R5381 Other malaise: Secondary | ICD-10-CM | POA: Diagnosis present

## 2016-02-23 DIAGNOSIS — Z9049 Acquired absence of other specified parts of digestive tract: Secondary | ICD-10-CM

## 2016-02-23 DIAGNOSIS — A419 Sepsis, unspecified organism: Secondary | ICD-10-CM | POA: Diagnosis present

## 2016-02-23 DIAGNOSIS — R0603 Acute respiratory distress: Secondary | ICD-10-CM | POA: Diagnosis present

## 2016-02-23 DIAGNOSIS — R410 Disorientation, unspecified: Secondary | ICD-10-CM

## 2016-02-23 DIAGNOSIS — R4020 Unspecified coma: Secondary | ICD-10-CM | POA: Diagnosis present

## 2016-02-23 DIAGNOSIS — R4182 Altered mental status, unspecified: Secondary | ICD-10-CM | POA: Diagnosis present

## 2016-02-23 DIAGNOSIS — Z880 Allergy status to penicillin: Secondary | ICD-10-CM | POA: Diagnosis not present

## 2016-02-23 DIAGNOSIS — Z515 Encounter for palliative care: Secondary | ICD-10-CM | POA: Diagnosis not present

## 2016-02-23 DIAGNOSIS — M729 Fibroblastic disorder, unspecified: Secondary | ICD-10-CM | POA: Diagnosis present

## 2016-02-23 DIAGNOSIS — R6520 Severe sepsis without septic shock: Secondary | ICD-10-CM | POA: Diagnosis not present

## 2016-02-23 DIAGNOSIS — I252 Old myocardial infarction: Secondary | ICD-10-CM

## 2016-02-23 DIAGNOSIS — E7800 Pure hypercholesterolemia, unspecified: Secondary | ICD-10-CM | POA: Diagnosis present

## 2016-02-23 DIAGNOSIS — J9690 Respiratory failure, unspecified, unspecified whether with hypoxia or hypercapnia: Secondary | ICD-10-CM

## 2016-02-23 LAB — BLOOD GAS, ARTERIAL
Acid-base deficit: 21.7 mmol/L — ABNORMAL HIGH (ref 0.0–2.0)
Bicarbonate: 6.2 mmol/L — ABNORMAL LOW (ref 20.0–28.0)
FIO2: 1
O2 SAT: 97.9 %
PCO2 ART: 20 mmHg — AB (ref 32.0–48.0)
PH ART: 7.1 — AB (ref 7.350–7.450)
Patient temperature: 37
pO2, Arterial: 135 mmHg — ABNORMAL HIGH (ref 83.0–108.0)

## 2016-02-23 LAB — LACTIC ACID, PLASMA
LACTIC ACID, VENOUS: 15 mmol/L — AB (ref 0.5–1.9)
Lactic Acid, Venous: 10.9 mmol/L (ref 0.5–1.9)
Lactic Acid, Venous: 11.9 mmol/L (ref 0.5–1.9)

## 2016-02-23 LAB — URINALYSIS COMPLETE WITH MICROSCOPIC (ARMC ONLY)
BILIRUBIN URINE: NEGATIVE
GLUCOSE, UA: NEGATIVE mg/dL
Hgb urine dipstick: NEGATIVE
KETONES UR: NEGATIVE mg/dL
LEUKOCYTES UA: NEGATIVE
NITRITE: NEGATIVE
PROTEIN: NEGATIVE mg/dL
SPECIFIC GRAVITY, URINE: 1.019 (ref 1.005–1.030)
pH: 5 (ref 5.0–8.0)

## 2016-02-23 LAB — PROTIME-INR
INR: 2.07
Prothrombin Time: 23.6 seconds — ABNORMAL HIGH (ref 11.4–15.2)

## 2016-02-23 LAB — CBC WITH DIFFERENTIAL/PLATELET
BAND NEUTROPHILS: 10 %
BASOS PCT: 0 %
Basophils Absolute: 0 10*3/uL (ref 0–0.1)
Blasts: 0 %
EOS ABS: 0 10*3/uL (ref 0–0.7)
EOS PCT: 0 %
HCT: 34 % — ABNORMAL LOW (ref 35.0–47.0)
Hemoglobin: 10.2 g/dL — ABNORMAL LOW (ref 12.0–16.0)
LYMPHS ABS: 1.1 10*3/uL (ref 1.0–3.6)
LYMPHS PCT: 5 %
MCH: 27.3 pg (ref 26.0–34.0)
MCHC: 30 g/dL — AB (ref 32.0–36.0)
MCV: 91 fL (ref 80.0–100.0)
MONO ABS: 0.5 10*3/uL (ref 0.2–0.9)
MYELOCYTES: 0 %
Metamyelocytes Relative: 0 %
Monocytes Relative: 2 %
NEUTROS PCT: 83 %
NRBC: 0 /100{WBCs}
Neutro Abs: 21.3 10*3/uL — ABNORMAL HIGH (ref 1.4–6.5)
OTHER: 0 %
PLATELETS: 276 10*3/uL (ref 150–440)
Promyelocytes Absolute: 0 %
RBC: 3.74 MIL/uL — ABNORMAL LOW (ref 3.80–5.20)
RDW: 20 % — ABNORMAL HIGH (ref 11.5–14.5)
WBC: 22.9 10*3/uL — ABNORMAL HIGH (ref 3.6–11.0)

## 2016-02-23 LAB — MRSA PCR SCREENING: MRSA BY PCR: POSITIVE — AB

## 2016-02-23 LAB — TROPONIN I: Troponin I: 0.07 ng/mL (ref <0.03)

## 2016-02-23 LAB — LIPASE, BLOOD: Lipase: 17 U/L (ref 11–51)

## 2016-02-23 LAB — APTT: APTT: 77 s — AB (ref 24–36)

## 2016-02-23 LAB — PROCALCITONIN: Procalcitonin: 51.59 ng/mL

## 2016-02-23 LAB — GLUCOSE, CAPILLARY: Glucose-Capillary: 174 mg/dL — ABNORMAL HIGH (ref 65–99)

## 2016-02-23 MED ORDER — ORAL CARE MOUTH RINSE
15.0000 mL | Freq: Two times a day (BID) | OROMUCOSAL | Status: DC
  Administered 2016-02-24 (×2): 15 mL via OROMUCOSAL

## 2016-02-23 MED ORDER — IPRATROPIUM-ALBUTEROL 0.5-2.5 (3) MG/3ML IN SOLN
3.0000 mL | RESPIRATORY_TRACT | Status: DC | PRN
Start: 2016-02-23 — End: 2016-02-25

## 2016-02-23 MED ORDER — DEXTROSE 5 % IV SOLN
2.0000 g | Freq: Once | INTRAVENOUS | Status: AC
  Administered 2016-02-23: 2 g via INTRAVENOUS
  Filled 2016-02-23: qty 2

## 2016-02-23 MED ORDER — DEXTROSE 5 % IV SOLN
1.0000 g | Freq: Two times a day (BID) | INTRAVENOUS | Status: DC
  Administered 2016-02-24: 1 g via INTRAVENOUS
  Filled 2016-02-23 (×2): qty 1

## 2016-02-23 MED ORDER — VANCOMYCIN HCL 500 MG IV SOLR
500.0000 mg | Freq: Once | INTRAVENOUS | Status: AC
  Administered 2016-02-23: 500 mg via INTRAVENOUS
  Filled 2016-02-23: qty 500

## 2016-02-23 MED ORDER — ONDANSETRON HCL 4 MG/2ML IJ SOLN: 4.0000 mg | Freq: Four times a day (QID) | INTRAMUSCULAR | Status: DC | PRN

## 2016-02-23 MED ORDER — LACTATED RINGERS IV BOLUS (SEPSIS)
1000.0000 mL | Freq: Once | INTRAVENOUS | Status: AC
  Administered 2016-02-23: 1000 mL via INTRAVENOUS
  Filled 2016-02-23: qty 1000

## 2016-02-23 MED ORDER — SODIUM CHLORIDE 0.9 % IV BOLUS (SEPSIS)
250.0000 mL | Freq: Once | INTRAVENOUS | Status: AC
  Administered 2016-02-23: 250 mL via INTRAVENOUS

## 2016-02-23 MED ORDER — SODIUM CHLORIDE 0.9 % IV BOLUS (SEPSIS)
1000.0000 mL | Freq: Once | INTRAVENOUS | Status: AC
  Administered 2016-02-23: 1000 mL via INTRAVENOUS

## 2016-02-23 MED ORDER — CHLORHEXIDINE GLUCONATE 0.12 % MT SOLN
15.0000 mL | Freq: Two times a day (BID) | OROMUCOSAL | Status: DC
  Administered 2016-02-23 – 2016-02-25 (×2): 15 mL via OROMUCOSAL
  Filled 2016-02-23 (×2): qty 15

## 2016-02-23 MED ORDER — LEVOFLOXACIN IN D5W 750 MG/150ML IV SOLN
750.0000 mg | Freq: Once | INTRAVENOUS | Status: AC
  Administered 2016-02-23: 750 mg via INTRAVENOUS
  Filled 2016-02-23: qty 150

## 2016-02-23 MED ORDER — VANCOMYCIN HCL IN DEXTROSE 1-5 GM/200ML-% IV SOLN
1000.0000 mg | Freq: Once | INTRAVENOUS | Status: AC
  Administered 2016-02-23: 1000 mg via INTRAVENOUS
  Filled 2016-02-23: qty 200

## 2016-02-23 MED ORDER — FAMOTIDINE IN NACL 20-0.9 MG/50ML-% IV SOLN
20.0000 mg | INTRAVENOUS | Status: DC
  Administered 2016-02-23 – 2016-02-24 (×2): 20 mg via INTRAVENOUS
  Filled 2016-02-23 (×2): qty 50

## 2016-02-23 MED ORDER — STERILE WATER FOR INJECTION IV SOLN
INTRAVENOUS | Status: DC
  Administered 2016-02-23 – 2016-02-24 (×2): via INTRAVENOUS
  Filled 2016-02-23 (×5): qty 850

## 2016-02-23 MED ORDER — SODIUM CHLORIDE 0.9 % IV SOLN: 250.0000 mL | INTRAVENOUS | Status: DC | PRN

## 2016-02-23 NOTE — ED Notes (Signed)
R AC noted very bruised prior to IV start, site used due to urgency for second IV.

## 2016-02-23 NOTE — Progress Notes (Signed)
[place] assessment, patient temperature was 96.0. NP was quickly notified . Bair hugger was applied as well as a rectal probe for core temperature. Also, patient was bladder scanned. It revealed that patent's bladder contained 325 ml. Order received to insert an indwelling foley catheter. Will continue to monitor patient.

## 2016-02-23 NOTE — ED Triage Notes (Signed)
Patient arrives emergency traffic minimally responsive, opens eyes only. MD, RN and RT in

## 2016-02-23 NOTE — ED Notes (Signed)
Code  Sepsis  Called  To  [facility] 

## 2016-02-23 NOTE — ED Provider Notes (Signed)
[facility] Emergency Department Provider Note  ____________________________________________  Time seen: Approximately 2:56 PM  I have reviewed the triage vital signs and the nursing notes.   HISTORY  Chief Complaint Altered Mental Status Level 5 caveat:  Portions of the history and physical were unable to be obtained due to the patient's acute illness    HPI Joann Huang is a 82 y.o. female brought to ED due to unresponsiveness. She was noted at all fields to be breathing hard. She had a very weak pulse that was nonpalpable. Patient was stuporous and not responsive. EMS report that her initial fingerstick glucose was 40. They got intraosseous access and gave one amp of D50.     Past Medical History:  Diagnosis Date  . Acute upper GI bleed 11/25/2015  . High cholesterol   . History of alcohol abuse    quit > 25years ago  . HTN (hypertension)    Not on any medications  . Hypertension   . Tobacco use      Patient Active Problem List   Diagnosis Date Noted  . Liver cirrhosis (HCC) 11/26/2015  . Fall   . Melena   . Acute blood loss anemia   . Duodenal ulcer 11/25/2015  . GI bleed 11/25/2015  . Leukocytosis 11/25/2015  . Elevated troponin 11/25/2015  . GERD (gastroesophageal reflux disease) 11/25/2015  . Weakness of both lower extremities 11/01/2015  . UTI (urinary tract infection) - with encephalopathy 11/01/2015  . Essential hypertension 11/01/2015  . Metabolic encephalopathy - related to UTI 11/01/2015  . NSTEMI (non-ST elevated myocardial infarction) (HCC) 10/31/2015     Past Surgical History:  Procedure Laterality Date  . APPENDECTOMY    . CHOLECYSTECTOMY    . ESOPHAGOGASTRODUODENOSCOPY N/A 11/26/2015   Procedure: ESOPHAGOGASTRODUODENOSCOPY (EGD);  Surgeon: Sherrilyn Rist, MD;  Location: [facility];  Service: Endoscopy;  Laterality: N/A;  . KNEE ARTHROSCOPY Left   . TONSILLECTOMY       Prior to Admission medications    Medication Sig Start Date End Date Taking? Authorizing Provider  acetaminophen (TYLENOL) 650 MG CR tablet Take 1,300 mg by mouth daily.   Yes Historical Provider, MD  atorvastatin (LIPITOR) 20 MG tablet Take 20 mg by mouth daily.   Yes Historical Provider, MD  bisacodyl (DULCOLAX) 5 MG EC tablet Take 1 tablet (5 mg total) by mouth daily as needed for moderate constipation. 12/10/15  Yes Shaune Pollack, MD  docusate sodium (COLACE) 100 MG capsule Take 1 capsule (100 mg total) by mouth 2 (two) times daily. 12/10/15  Yes Shaune Pollack, MD  furosemide (LASIX) 20 MG tablet Take 20 mg by mouth.   Yes Historical Provider, MD  metoprolol succinate (TOPROL-XL) 50 MG 24 hr tablet Take 1 tablet (50 mg total) by mouth daily. Take with or immediately following a meal. 11/03/15  Yes Altamese Dilling, MD  pantoprazole (PROTONIX) 40 MG tablet Take 1 tablet (40 mg total) by mouth 2 (two) times daily before a meal. 11/28/15  Yes Clydia Llano, MD  potassium chloride SA (K-DUR,KLOR-CON) 20 MEQ tablet Take 40 mEq by mouth 2 (two) times daily.   Yes Historical Provider, MD  sucralfate (CARAFATE) 1 GM/10ML suspension Take 10 mLs (1 g total) by mouth 4 (four) times daily -  with meals and at bedtime. 12/10/15  Yes Shaune Pollack, MD     Allergies Sulfa antibiotics and Penicillins   Family History  Problem Relation Age of Onset  . CAD Mother  Social History Social History  Substance Use Topics  . Smoking status: Former Smoker    Packs/day: 1.00    Years: 65.00    Types: Cigarettes    Quit date: 07/12/2015  . Smokeless tobacco: Never Used  . Alcohol use 0.0 oz/week     Comment: h/o heavy alcohol abuse; nothing since 07/2015 (11/25/2015)    Review of Systems Unable to obtain due to altered mental status and acute critical illness ____________________________________________   PHYSICAL EXAM:  VITAL SIGNS: ED Triage Vitals  Enc Vitals Group     BP 03/01/2016 1250 110/73     Pulse Rate 02/24/2016 1250 (!) 114      Resp 03/08/2016 1250 (!) 30     Temp 03/06/2016 1422 (!) 95 F (35 C)     Temp Source 02/26/2016 1422 Rectal     SpO2 02/11/2016 1250 100 %     Weight 02/19/2016 1304 140 lb (63.5 kg)     Height --      Head Circumference --      Peak Flow --      Pain Score --      Pain Loc --      Pain Edu? --      Excl. in GC? --     Vital signs reviewed, nursing assessments reviewed.   Constitutional:   Awake, stuporous. Moderate distress. Eyes:   No scleral icterus. No conjunctival pallor. PERRL. EOMI.   ENT   Head:   Normocephalic and atraumatic.   Nose:   No congestion/rhinnorhea. No septal hematoma   Mouth/Throat:   Dry mucous membranes, no pharyngeal erythema. No peritonsillar mass.    Neck:   No stridor. No SubQ emphysema. No meningismus. Hematological/Lymphatic/Immunilogical:   No cervical lymphadenopathy. Cardiovascular:   Tachycardia heart rate 115. Peripheral pulses nonpalpable. Thready carotid and femoral pulses, which are symmetric.  No murmurs.  Respiratory:   Tachypnea with accessory muscle use. Breath sounds are clear and symmetric.Marland Kitchen. Gastrointestinal:   Soft and nontender. Non distended. There is no CVA tenderness.  No rebound, rigidity, or guarding. No pulsatile mass or abdominal bruit Genitourinary:   deferred Musculoskeletal:   Nontender with normal range of motion in all extremities. No joint effusions.  No lower extremity tenderness.  No edema. Neurologic:    Unable to assess due to patient's limited ability to participate in exam.  Skin:    Skin is warm, dry and intact. No rash noted.  No petechiae, purpura, or bullae. Poor skin turgor ____________________________________________    LABS (pertinent positives/negatives) (all labs ordered are listed, but only abnormal results are displayed) Labs Reviewed  GLUCOSE, CAPILLARY - Abnormal; Notable for the following:       Result Value   Glucose-Capillary 174 (*)    All other components within normal limits   COMPREHENSIVE METABOLIC PANEL - Abnormal; Notable for the following:    CO2 9 (*)    Glucose, Bld 172 (*)    BUN 37 (*)    Creatinine, Ser 2.53 (*)    Calcium 8.0 (*)    Total Protein 4.4 (*)    Albumin 1.3 (*)    AST 62 (*)    Alkaline Phosphatase 151 (*)    GFR calc non Af Amer 16 (*)    GFR calc Af Amer 19 (*)    Anion gap 22 (*)    All other components within normal limits  TROPONIN I - Abnormal; Notable for the following:    Troponin I 0.07 (*)  All other components within normal limits  CBC WITH DIFFERENTIAL/PLATELET - Abnormal; Notable for the following:    WBC 22.9 (*)    RBC 3.74 (*)    Hemoglobin 10.2 (*)    HCT 34.0 (*)    MCHC 30.0 (*)    RDW 20.0 (*)    Neutro Abs 21.3 (*)    All other components within normal limits  APTT - Abnormal; Notable for the following:    aPTT 77 (*)    All other components within normal limits  PROTIME-INR - Abnormal; Notable for the following:    Prothrombin Time 23.6 (*)    All other components within normal limits  URINALYSIS COMPLETEWITH MICROSCOPIC ([facility] ONLY) - Abnormal; Notable for the following:    Color, Urine AMBER (*)    APPearance HAZY (*)    Bacteria, UA MANY (*)    Squamous Epithelial / LPF 0-5 (*)    All other components within normal limits  BLOOD GAS, ARTERIAL - Abnormal; Notable for the following:    pH, Arterial 7.10 (*)    pCO2 arterial 20 (*)    pO2, Arterial 135 (*)    Bicarbonate 6.2 (*)    Acid-base deficit 21.7 (*)    All other components within normal limits  CULTURE, BLOOD (ROUTINE X 2)  CULTURE, BLOOD (ROUTINE X 2)  CULTURE, EXPECTORATED SPUTUM-ASSESSMENT  URINE CULTURE  LIPASE, BLOOD  LACTIC ACID, PLASMA  LACTIC ACID, PLASMA   ____________________________________________   EKG  Interpreted by me Sinus tachycardia rate 106, normal axis intervals QRS ST segments and T waves. Low voltage without electrical alternans.  ____________________________________________    RADIOLOGY  Chest  x-ray unremarkable, underlying COPD. CT head unremarkable CT abdomen and pelvis reveals cirrhosis and increased ascites  ____________________________________________   PROCEDURES Procedures CRITICAL CARE Performed by: Scotty Court, Tilton Marsalis   Total critical care time: 35 minutes  Critical care time was exclusive of separately billable procedures and treating other patients.  Critical care was necessary to treat or prevent imminent or life-threatening deterioration.  Critical care was time spent personally by me on the following activities: development of treatment plan with patient and/or surrogate as well as nursing, discussions with consultants, evaluation of patient's response to treatment, examination of patient, obtaining history from patient or surrogate, ordering and performing treatments and interventions, ordering and review of laboratory studies, ordering and review of radiographic studies, pulse oximetry and re-evaluation of patient's condition.  ____________________________________________   INITIAL IMPRESSION / ASSESSMENT AND PLAN / ED COURSE  Pertinent labs & imaging results that were available during my care of the patient were reviewed by me and considered in my medical decision making (see chart for details).  Patient presents with altered mental status, hypoglycemia, hypothermia, tachycardia. Good sepsis initiated upon initial assessment. Penicillin allergic, so aztreonam vancomycin and Levaquin. IV fluid. CT scan initially unremarkable. However, labs show acute renal failure and severe leukocytosis with high neutrophil predominance and high anion gap metabolic acidosis. We'll admit for sepsis management. I think the elevated troponin is not due to ACS but rather due to the acidosis and heart strain from acute illness.    ----------------------------------------- 3:39 PM on 03/09/2016 -----------------------------------------  Case discussed with critical care for  admission. Lactate of 15. CT reveals cirrhosis and increased ascites. Repeat abdominal exam does show tenderness diffusely without rigidity. Concern for SBP. We'll continue antibiotics. Blood pressure seems to be downtrending. We'll give another bolus of IV fluids. Intensivist Dr. Darrol Angel is at bedside. Care discussed with the son  who indicates that his mother has not filled out any formal paperwork but does believe that she would not want CPR or mechanical ventilation. CT comments on possible chest wall emphysema. On exam I cannot find anything to corroborate this. There is no apparent inflammatory change of the chest wall.   Clinical Course    ____________________________________________   FINAL CLINICAL IMPRESSION(S) / ED DIAGNOSES  Final diagnoses:  Delirium  Sepsis, due to unspecified organism [facility](HCC)  Acute renal failure, unspecified acute renal failure type (HCC)  Spontaneous bacterial peritonitis.     Portions of this note were generated with dragon dictation software. Dictation errors may occur despite best attempts at proofreading.    Sharman CheekPhillip Zoriyah Scheidegger, MD 02/12/2016 1540

## 2016-02-23 NOTE — H&P (Signed)
PULMONARY / CRITICAL CARE MEDICINE   Name: Joann Huang MRN: [id number] DOB: 20-Dec-1930    ADMISSION DATE:  09/05/2015  PT PROFILE: 79F NH resident with alcoholic cirrhosis, h/o UGIB, adult failure to thrive admitted via ED with AMS, hypotension, hypothermia, tachypnea, metabolic acidosis with elevated lactate, admission diagnosis of severe sepsis/septic shock of unclear source  MAJOR EVENTS/TEST RESULTS: 11/13 CTAP: Moderately motion degraded examination. Cirrhosis. New moderate amount of ascites. Asymmetrically thickened R pectoralis muscle and R chest wall edema/cellulitis 11/13 CT head: No acute abnormality  INDWELLING DEVICES:: LLE intra-osseus device 11/13 >>   MICRO DATA: Urine 11/13 >>  Blood 11/13 >>    ANTIMICROBIALS:  Levofloxacin 11/13 X 1 Vanc 11/13 >>  Aztreonam 11/13 >>   HISTORY OF PRESENT ILLNESS:   Very frail 82 yo F nursing home resident since last hospitalization in August 2017 (for UGIB) who, per her son-in-law,  was in her usual state of poor health 2 days prior to admission with no distress, profound chronic weakness and waxing and waning orientation. Son in law was called on the day of admission and informed that [name]. He is unable to provide any further history. EMS placed a L intra-osseus access device. She was unable to provide further history to the ED physician and to me. In the ED, she was found to be lethargic, tachypneic, hypothermic and hypotension. She is admitted with a presumed diagnosis of severe sepsis and septic shock with MODS. There are multiple potential sites of infection  PAST MEDICAL HISTORY :  She  has a past medical history of Acute upper GI bleed (11/25/2015); High cholesterol; History of alcohol abuse; HTN (hypertension); Hypertension; and Tobacco use.  PAST SURGICAL HISTORY: She  has a past surgical history that includes Appendectomy; Tonsillectomy; Cholecystectomy; Knee arthroscopy  (Left); and Esophagogastroduodenoscopy (N/A, 11/26/2015).  Allergies  Allergen Reactions  . Sulfa Antibiotics Hives and Swelling  . Penicillins Hives and Other (See Comments)    Has patient had a PCN reaction causing immediate rash, facial/tongue/throat swelling, SOB or lightheadedness with hypotension: yes Has patient had a PCN reaction causing severe rash involving mucus membranes or skin necrosis: No Has patient had a PCN reaction that required hospitalization No Has patient had a PCN reaction occurring within the last 10 years: No If all of the above answers are "NO", then may proceed with Cephalosporin use.    No current facility-administered medications on file prior to encounter.    Current Outpatient Prescriptions on File Prior to Encounter  Medication Sig  . acetaminophen (TYLENOL) 650 MG CR tablet Take 1,300 mg by mouth daily.  . bisacodyl (DULCOLAX) 5 MG EC tablet Take 1 tablet (5 mg total) by mouth daily as needed for moderate constipation.  . docusate sodium (COLACE) 100 MG capsule Take 1 capsule (100 mg total) by mouth 2 (two) times daily.  . metoprolol succinate (TOPROL-XL) 50 MG 24 hr tablet Take 1 tablet (50 mg total) by mouth daily. Take with or immediately following a meal.  . pantoprazole (PROTONIX) 40 MG tablet Take 1 tablet (40 mg total) by mouth 2 (two) times daily before a meal.  . sucralfate (CARAFATE) 1 GM/10ML suspension Take 10 mLs (1 g total) by mouth 4 (four) times daily -  with meals and at bedtime.    FAMILY HISTORY:  Her indicated that the status of her mother is unknown.    SOCIAL HISTORY: She  reports that she quit smoking about 7  months ago. Her smoking use included Cigarettes. She has a 65.00 pack-year smoking history. She has never used smokeless tobacco. She reports that she drinks alcohol. She reports that she does not use drugs.   She has resided in a NH since August and is generally nonambulatory and full assist making little or no progress  with attempts at PT. Her Son in law indicates that she has not drunk heavily in many years, the carried forward documentation notwithstanding. She smoked heavily until her last hospitalization in August  REVIEW OF SYSTEMS:   Level 5 caveat  SUBJECTIVE:    VITAL SIGNS: BP (!) 96/46   Pulse (!) 102   Temp (!) 95 F (35 C) (Rectal)   Resp (!) 23   Wt 140 lb (63.5 kg)   SpO2 100%   BMI 22.60 kg/m   HEMODYNAMICS:    VENTILATOR SETTINGS:    INTAKE / OUTPUT: No intake/output data recorded.  PHYSICAL EXAMINATION: General: acute and chronically ill appearing, tachypneic, RASS -2 Neuro: Lethargic, minimal spontaneous movement, DTRs symmetric HEENT: NCAT, no scleral icterus Cardiovascular: reg, no M Lungs: clear anteriorly Abdomen: soft, nondistended, diminished to absent BS, no rebound tenderness, no guarding Ext: extensive ecchymoses and excoriations on all extremities, BLEs in wraps, 3+ symmetric pedal edema Skin: facial and chest telangiectasias   LABS:  BMET  Recent Labs Lab 02/20/2016 1308  NA 136  K 4.7  CL 105  CO2 9*  BUN 37*  CREATININE 2.53*  GLUCOSE 172*    Electrolytes  Recent Labs Lab 02/16/2016 1308  CALCIUM 8.0*    CBC  Recent Labs Lab 03/04/2016 1308  WBC 22.9*  HGB 10.2*  HCT 34.0*  PLT 276    Coag's  Recent Labs Lab 02/17/2016 1308  APTT 77*  INR 2.07    Sepsis Markers  Recent Labs Lab 03/11/2016 1307  LATICACIDVEN 15.0*    ABG  Recent Labs Lab 03/07/2016 1305  PHART 7.10*  PCO2ART 20*  PO2ART 135*    Liver Enzymes  Recent Labs Lab 02/24/2016 1308  AST 62*  ALT 20  ALKPHOS 151*  BILITOT 0.7  ALBUMIN 1.3*    Cardiac Enzymes  Recent Labs Lab 03/11/2016 1308  TROPONINI 0.07*    Glucose  Recent Labs Lab 03/09/2016 1255  GLUCAP 174*    CXR: emphysema, no acute infiltrates   ASSESSMENT / PLAN:  PULMONARY A: Acute respiratory distress due to metabolic acidosis Former smoker, suspected COPD No acute  bronchospasm P:   Supplemental O2 to maintain SpO2 > 92% PRN bronchodilators for wheezing DNI (see below)  CARDIOVASCULAR A:  Septic shock P:  Volume resuscitate Consider vasopressors as needed to maintain MAP > 65 mmHg  RENAL A:   AKI Severe metabolic acidosis with elevated lactate P:   Volume resuscitate HCO3 infusion Monitor BMET intermittently Monitor I/Os Correct electrolytes as indicated Not a candidate for HD (see below)  GASTROINTESTINAL A:   Alcoholic cirrhosis Recent UGIB Chronic PPI therapy P:   SUP: IV famotidine Transition to PO PPI when able NPO until cognition improves  HEMATOLOGIC A:   Chronic anemia without overt acute blood loss Coagulopathy due to ESLD P:  DVT px: coagulopathic Monitor CBC/coags intermittently Transfuse per usual guidelines   INFECTIOUS A:   Severe sepsis - unclear source. SBP vs skin source. Lees likely PNA PCN allergy P:   Monitor temp, WBC count Micro and abx as above PCT algorithm  ENDOCRINE A:   Stress induced hyperglycemia without prior hx of DM High  risk of hypoglycemia due to AKI and ESLD P:   Monitor glu on chem panels Consider SSI for glu > 200  NEUROLOGIC A:   Acute encephalopathy - septic, hepatic P:   RASS goal: 0 Minimize sedative analgesics Check ammonia level AM 11/14   FAMILY  I spoke with son-in-law in person and subsequently with pt's daughter over phone (she is in [place] on business). They both are in agreement that, in light of Ms Zeitler's failing health over the past six months and profoundly debilitated state at baseline, no heroic measures such as intubation, mechanical ventilation, ACLS/CPR or hemodialysis would be appropriate at this time.     CCM time: 45 mins  The above time includes time spent in consultation with patient and/or family members and reviewing care plan on multidisciplinary rounds  Billy Fischer, MD PCCM service Mobile [phone] Pager [phone]      02-Mar-2016, 4:09 PM

## 2016-02-23 NOTE — Progress Notes (Signed)
Pharmacy Antibiotic Note  Joann Huang is a 82 y.o. female admitted on 02/26/2016 with sepsis.  Pharmacy has been consulted for Vancomycin and Aztreonam dosing. Patient received Vancomycin 1gm IV x1, Levofloxacin 750mg  x1 and Aztreonam 2gm x1 in ED.   Plan: Will give the patient an additional Vancomycin 500mg  ( to equal a 25mg /kg bolus dose with vancomycin 1gm dose given in ED). Patient will most likely need Q48 hour vanc dosing. Pharmacy will continue to monitor renal function for improvement and dose vancomycin accordingly.   Will start the patient on Aztreonam 1gm IV every 12 hours. Patients has allergy listed for PCN which is reported to be  Hives. Consider changing Aztreonam to a carbapenem.   Weight: 140 lb (63.5 kg)  Temp (24hrs), Avg:95 F (35 C), Min:95 F (35 C), Max:95 F (35 C)   Recent Labs Lab 03/01/2016 1307 02/27/2016 1308  WBC  --  22.9*  CREATININE  --  2.53*  LATICACIDVEN 15.0*  --     Estimated Creatinine Clearance: 15.8 mL/min (by C-G formula based on SCr of 2.53 mg/dL (H)).    Allergies  Allergen Reactions  . Sulfa Antibiotics Hives and Swelling  . Penicillins Hives and Other (See Comments)    Has patient had a PCN reaction causing immediate rash, facial/tongue/throat swelling, SOB or lightheadedness with hypotension: yes Has patient had a PCN reaction causing severe rash involving mucus membranes or skin necrosis: No Has patient had a PCN reaction that required hospitalization No Has patient had a PCN reaction occurring within the last 10 years: No If all of the above answers are "NO", then may proceed with Cephalosporin use.    Antimicrobials this admission: 11/13 levofloxacin >> 11/13 11/13 vancomycin >> 11/13 Azreonam >>   Dose adjustments this admission:  Microbiology results: 11/13 BCx: pending 11/13 UCx: pending  11/13 MRSA PCR:   Thank you for allowing pharmacy to be a part of this patient's care.  Gardner CandleSheema M Salina Stanfield, PharmD Clinical  Pharmacist  02/24/2016 4:24 PM

## 2016-02-23 NOTE — ED Notes (Signed)
To CT with nurse.

## 2016-02-24 ENCOUNTER — Inpatient Hospital Stay: Payer: Medicare Other

## 2016-02-24 LAB — COMPREHENSIVE METABOLIC PANEL
ALBUMIN: 1.3 g/dL — AB (ref 3.5–5.0)
ALT: 20 U/L (ref 14–54)
ALT: 44 U/L (ref 14–54)
AST: 62 U/L — AB (ref 15–41)
AST: 196 U/L — AB (ref 15–41)
Alkaline Phosphatase: 104 U/L (ref 38–126)
Alkaline Phosphatase: 151 U/L — ABNORMAL HIGH (ref 38–126)
Anion gap: 17 — ABNORMAL HIGH (ref 5–15)
Anion gap: 22 — ABNORMAL HIGH (ref 5–15)
BILIRUBIN TOTAL: 0.7 mg/dL (ref 0.3–1.2)
BUN: 37 mg/dL — AB (ref 6–20)
BUN: 36 mg/dL — AB (ref 6–20)
CHLORIDE: 111 mmol/L (ref 101–111)
CO2: 9 mmol/L — ABNORMAL LOW (ref 22–32)
CO2: 11 mmol/L — AB (ref 22–32)
CREATININE: 2.53 mg/dL — AB (ref 0.44–1.00)
CREATININE: 2.14 mg/dL — AB (ref 0.44–1.00)
Calcium: 6.8 mg/dL — ABNORMAL LOW (ref 8.9–10.3)
Calcium: 8 mg/dL — ABNORMAL LOW (ref 8.9–10.3)
Chloride: 105 mmol/L (ref 101–111)
GFR calc Af Amer: 19 mL/min — ABNORMAL LOW (ref >60)
GFR calc Af Amer: 23 mL/min — ABNORMAL LOW (ref >60)
GFR, EST NON AFRICAN AMERICAN: 16 mL/min — AB (ref >60)
GFR, EST NON AFRICAN AMERICAN: 20 mL/min — AB (ref >60)
GLUCOSE: 172 mg/dL — AB (ref 65–99)
GLUCOSE: 147 mg/dL — AB (ref 65–99)
POTASSIUM: 4.7 mmol/L (ref 3.5–5.1)
Potassium: 3.4 mmol/L — ABNORMAL LOW (ref 3.5–5.1)
Sodium: 136 mmol/L (ref 135–145)
Sodium: 139 mmol/L (ref 135–145)
TOTAL PROTEIN: 4.4 g/dL — AB (ref 6.5–8.1)
Total Bilirubin: 0.7 mg/dL (ref 0.3–1.2)
Total Protein: 3.5 g/dL — ABNORMAL LOW (ref 6.5–8.1)

## 2016-02-24 LAB — GLUCOSE, CAPILLARY
GLUCOSE-CAPILLARY: 136 mg/dL — AB (ref 65–99)
GLUCOSE-CAPILLARY: 146 mg/dL — AB (ref 65–99)
GLUCOSE-CAPILLARY: 15 mg/dL — AB (ref 65–99)
GLUCOSE-CAPILLARY: 80 mg/dL (ref 65–99)
GLUCOSE-CAPILLARY: 138 mg/dL — AB (ref 65–99)
GLUCOSE-CAPILLARY: 143 mg/dL — AB (ref 65–99)
GLUCOSE-CAPILLARY: 137 mg/dL — AB (ref 65–99)
GLUCOSE-CAPILLARY: 126 mg/dL — AB (ref 65–99)
GLUCOSE-CAPILLARY: 96 mg/dL (ref 65–99)
Glucose-Capillary: 51 mg/dL — ABNORMAL LOW (ref 65–99)
Glucose-Capillary: 38 mg/dL — CL (ref 65–99)
Glucose-Capillary: 77 mg/dL (ref 65–99)
Glucose-Capillary: 99 mg/dL (ref 65–99)

## 2016-02-24 LAB — PROTIME-INR
INR: 2.1
PROTHROMBIN TIME: 23.9 s — AB (ref 11.4–15.2)

## 2016-02-24 LAB — CBC
HCT: 27.9 % — ABNORMAL LOW (ref 35.0–47.0)
Hemoglobin: 8.9 g/dL — ABNORMAL LOW (ref 12.0–16.0)
MCH: 27.1 pg (ref 26.0–34.0)
MCHC: 31.9 g/dL — ABNORMAL LOW (ref 32.0–36.0)
MCV: 84.9 fL (ref 80.0–100.0)
PLATELETS: 212 10*3/uL (ref 150–440)
RBC: 3.29 MIL/uL — AB (ref 3.80–5.20)
RDW: 19.5 % — ABNORMAL HIGH (ref 11.5–14.5)
WBC: 16.6 10*3/uL — AB (ref 3.6–11.0)

## 2016-02-24 LAB — PROCALCITONIN: PROCALCITONIN: 38.14 ng/mL

## 2016-02-24 LAB — LACTIC ACID, PLASMA
LACTIC ACID, VENOUS: 11.4 mmol/L — AB (ref 0.5–1.9)
Lactic Acid, Venous: 15.3 mmol/L (ref 0.5–1.9)

## 2016-02-24 LAB — AMMONIA: Ammonia: 34 umol/L (ref 9–35)

## 2016-02-24 LAB — APTT: aPTT: 77 seconds — ABNORMAL HIGH (ref 24–36)

## 2016-02-24 MED ORDER — SODIUM BICARBONATE 8.4 % IV SOLN
INTRAVENOUS | Status: DC
  Administered 2016-02-24 – 2016-02-25 (×2): via INTRAVENOUS
  Filled 2016-02-24 (×5): qty 150

## 2016-02-24 MED ORDER — VANCOMYCIN HCL 10 G IV SOLR
1250.0000 mg | INTRAVENOUS | Status: DC
  Administered 2016-02-24: 1250 mg via INTRAVENOUS
  Filled 2016-02-24: qty 1250

## 2016-02-24 MED ORDER — DEXTROSE 5 % IV SOLN: INTRAVENOUS | Status: DC

## 2016-02-24 MED ORDER — DEXTROSE 10 % IV SOLN
INTRAVENOUS | Status: DC
  Administered 2016-02-24: 50 mL/h via INTRAVENOUS

## 2016-02-24 MED ORDER — SODIUM CHLORIDE 0.9 % IV SOLN
30.0000 meq | Freq: Once | INTRAVENOUS | Status: AC
  Administered 2016-02-24: 30 meq via INTRAVENOUS
  Filled 2016-02-24: qty 15

## 2016-02-24 MED ORDER — DEXTROSE 5 % IV SOLN
INTRAVENOUS | Status: DC
  Administered 2016-02-24: 03:00:00 via INTRAVENOUS

## 2016-02-24 MED ORDER — DEXTROSE 50 % IV SOLN
INTRAVENOUS | Status: AC
  Administered 2016-02-24: 50 mL via INTRAVENOUS
  Filled 2016-02-24: qty 50

## 2016-02-24 MED ORDER — PHENYLEPHRINE HCL 10 MG/ML IJ SOLN
0.0000 ug/min | INTRAVENOUS | Status: DC
  Administered 2016-02-24: 40 ug/min via INTRAVENOUS
  Administered 2016-02-24 – 2016-02-25 (×3): 20 ug/min via INTRAVENOUS
  Filled 2016-02-24 (×3): qty 1

## 2016-02-24 MED ORDER — DEXTROSE 50 % IV SOLN
1.0000 | Freq: Once | INTRAVENOUS | Status: AC
  Administered 2016-02-24: 50 mL via INTRAVENOUS

## 2016-02-24 MED ORDER — CHLORHEXIDINE GLUCONATE CLOTH 2 % EX PADS
6.0000 | MEDICATED_PAD | Freq: Every day | CUTANEOUS | Status: DC
  Administered 2016-02-24 – 2016-02-25 (×2): 6 via TOPICAL

## 2016-02-24 MED ORDER — DEXTROSE 5 % IV SOLN
0.0000 ug/min | INTRAVENOUS | Status: DC
  Administered 2016-02-24: 20 ug/min via INTRAVENOUS
  Filled 2016-02-24: qty 4

## 2016-02-24 MED ORDER — MUPIROCIN 2 % EX OINT
1.0000 "application " | TOPICAL_OINTMENT | Freq: Two times a day (BID) | CUTANEOUS | Status: DC
  Administered 2016-02-24 – 2016-02-25 (×4): 1 via NASAL
  Filled 2016-02-24: qty 22

## 2016-02-24 MED ORDER — DEXTROSE 50 % IV SOLN
1.0000 | Freq: Once | INTRAVENOUS | Status: AC
  Administered 2016-02-24: 50 mL via INTRAVENOUS
  Filled 2016-02-24: qty 50

## 2016-02-24 MED ORDER — CEFTRIAXONE SODIUM-DEXTROSE 2-2.22 GM-% IV SOLR
2.0000 g | INTRAVENOUS | Status: DC
  Administered 2016-02-24: 2 g via INTRAVENOUS
  Filled 2016-02-24 (×2): qty 50

## 2016-02-24 NOTE — Progress Notes (Addendum)
Pt unresponsive. Pt will open eyes to movement and slight w/d from pain . No purposeful movements noted. Pt on NEO-Synepherine for low blood pressure .which have been effective to keep MAP >65.  Pt continued on bicarb drip as ordered .new orders to obtain lactic acid this morning.

## 2016-02-24 NOTE — Clinical Social Work Note (Signed)
Clinical Social Work Assessment  Patient Details  Name: Joann Huang MRN: [id number] Date of Birth: 1930/06/15  Date of referral:  02/24/16               Reason for consult:  Other (Comment Required) (From facility [facility])                Permission sought to share information with:  Oceanographeracility Contact Representative Permission granted to share information::  Yes, Verbal Permission Granted  Name::      Joann Huang   Agency::   Skilled Nursing Facility   Relationship::     Contact Information:     Housing/Transportation Living arrangements for the past 2 months:  Skilled Building surveyorursing Facility Source of Information:  Adult Children, Facility Patient Interpreter Needed:  None Criminal Activity/Legal Involvement Pertinent to Current Situation/Hospitalization:  No - Comment as needed Significant Relationships:  Adult Children Lives with:  Facility Resident Do you feel safe going back to the place where you live?    Need for family participation in patient care:  Yes (Comment)  Care giving concerns: Patient is a long term care resident [facility].    Social Worker assessment / plan:  Visual merchandiserClinical Social Worker (CSW) received consult that patient is from [facility]. Per chart patient is unresponsive so CSW contacted patient's daughter Joann Huang. Per daughter patient has been at [facility] since July 2017 and recently got approved for long term care Medicaid in September 2017. Per daughter patient started out under [facility] and then transitioned to long term care. Daughter reported that she is patient's only child and patient's spouse is deceased. Per daughter patient has no HPOA in place however they were working on that before patient was hospitalized. Per daughter MD stated that patient is likely at the end of life and may pass away soon. Daughter is agreeable for patient to return [facility] if she does not pass away. Per [facility]  patient can return if stable.   FL2 complete. CSW will continue to follow and assist as needed.   Employment status:  Retired Health and safety inspectornsurance information:  Armed forces operational officerMedicare, [place] Recommendations:  Not assessed at this time Information / Referral to community resources:  Skilled Nursing Facility  Patient/Family's Response to care:  Patient is unresponsive and could not participate in assessment. Daughter appears to be realistic about patient's prognosis.   Patient/Family's Understanding of and Emotional Response to Diagnosis, Current Treatment, and Prognosis:  Daughter was pleasant and thanked CSW for calling.   Emotional Assessment Appearance:  Appears stated age Attitude/Demeanor/Rapport:  Unable to Assess, Unresponsive Affect (typically observed):  Unable to Assess Orientation:  Fluctuating Orientation (Suspected and/or reported Sundowners) Alcohol / Substance use:  Not Applicable Psych involvement (Current and /or in the community):  No (Comment)  Discharge Needs  Concerns to be addressed:  Discharge Planning Concerns Readmission within the last 30 days:  No Current discharge risk:  Chronically ill, Cognitively Impaired, Dependent with Mobility Barriers to Discharge:  Continued Medical Work up   [employer], Joann CrockerBailey M, LCSW 02/24/2016, 3:55 PM

## 2016-02-24 NOTE — Progress Notes (Signed)
This RN took over the care of patient at 1500, patient continues to be minimally responsive, on 3L [place], pulse ox in upper 90s to 100%.  Vital signs stable except hypotensive requiring neo gtt, currently on 5720mcg/min for map of 65.   Bicarb with D5 infusing at 19000ml/hr.  Patient hypothermic, baer hugger in place.  Sinus Tach on cardiac monitor.  Foley in place and patent output 75ml, per previous RN, MD aware of decreased output.  Patient currently in no apparent distress, RN continue to monitor.

## 2016-02-24 NOTE — Consult Note (Signed)
WOC Nurse wound consult note Reason for Consult: Trauma wounds (skin tears and abrasions) to bilateral lower extremities and minimally present to arms.  Wound type:skin tears and abrasions Pressure Ulcer POA: Yes stage 2 sacrum Measurement: 1 cm diameter lesions to bilateral lower legs.   Stage 2 0.5 cm pressure injury to coccyx 0.5 cm diameter lesion to left and right arm Wound ZOX:WRUEAVWbed:scabbed, red Drainage (amount, consistency, odor) Minimal serosanguinous  No odor.  Periwound:Ecchymosis Dressing procedure/placement/frequency:Cleanse wounds to arms legs and sacrum with NS and pat gently dry.  Apply silicone border foam dressing.  Change every 3 days and PRN soilage.  Will not follow at this time.  Please re-consult if needed.  Maple HudsonKaren Ilay Capshaw RN BSN CWON Pager [phone]

## 2016-02-24 NOTE — Progress Notes (Signed)
PULMONARY / CRITICAL CARE MEDICINE   Name: Joann Huang MRN: [id number] DOB: [birth year]    ADMISSION DATE:  06/01/2015  PT PROFILE: 56F NH resident with alcoholic cirrhosis, h/o UGIB, adult failure to thrive admitted via ED with AMS, hypotension, hypothermia, tachypnea, metabolic acidosis with elevated lactate, admission diagnosis of severe sepsis/septic shock of unclear source  MAJOR EVENTS/TEST RESULTS: 11/13 CTAP: Moderately motion degraded examination. Cirrhosis. New moderate amount of ascites. Asymmetrically thickened R pectoralis muscle and R chest wall edema/cellulitis 11/13 CT head: No acute abnormality 11/13 PM: phenylephrine initiated to maintain MAP > 65 mmHg  INDWELLING DEVICES:: LLE intra-osseus device 11/13 >>   MICRO DATA: Urine 11/13 >>  Blood 11/13 >>   ANTIMICROBIALS:  Levofloxacin 11/13 X 1 Aztreonam 11/13 >> 11/14 Vanc 11/13 >>  Ceftriaxone 11/14 >>   SUBJECTIVE:  RASS -4, no distress  VITAL SIGNS: BP (!) 107/46 (BP Location: Left Arm)   Pulse (!) 110   Temp (!) 96.3 F (35.7 C) (Rectal) Comment (Src): bearhugger reapplied  Resp (!) 30   Wt 148 lb 9.4 oz (67.4 kg)   SpO2 97%   BMI 23.98 kg/m   HEMODYNAMICS:    VENTILATOR SETTINGS: FiO2 (%):  [100 %] 100 %  INTAKE / OUTPUT: I/O last 3 completed shifts: In: 4429.5 [I.V.:1379.5; IV Piggyback:3050] Out: 600 [Urine:600]  PHYSICAL EXAMINATION: General: acute and chronically ill appearing, mildly tachypneic Neuro: RASS -4, minimal spontaneous movement, no withdrawal HEENT: NCAT, no scleral icterus Cardiovascular: reg, no M Chest: Lateral aspect of R breast indurated and minimally erythematous Lungs: clear anteriorly Abdomen: soft, nondistended, diminished to absent BS, no rebound tenderness, no guarding Ext: extensive ecchymoses and excoriations on all extremities, BLEs in wraps, 2+ symmetric pedal edema Skin: facial and chest telangiectasias   LABS:  BMET  Recent Labs Lab  2015/07/21 1308 02/24/16 0055  NA 136 139  K 4.7 3.4*  CL 105 111  CO2 9* 11*  BUN 37* 36*  CREATININE 2.53* 2.14*  GLUCOSE 172* 147*    Electrolytes  Recent Labs Lab 2015/07/21 1308 02/24/16 0055  CALCIUM 8.0* 6.8*    CBC  Recent Labs Lab 2015/07/21 1308 02/24/16 0055  WBC 22.9* 16.6*  HGB 10.2* 8.9*  HCT 34.0* 27.9*  PLT 276 212    Coag's  Recent Labs Lab 2015/07/21 1308 02/24/16 0055  APTT 77* 77*  INR 2.07 2.10    Sepsis Markers  Recent Labs Lab 2015/07/21 1308 2015/07/21 1651 2015/07/21 2058 02/24/16 0055 02/24/16 0808  LATICACIDVEN  --  10.9* 11.9*  --  15.3*  PROCALCITON 51.59  --   --  38.14  --     ABG  Recent Labs Lab 2015/07/21 1305  PHART 7.10*  PCO2ART 20*  PO2ART 135*    Liver Enzymes  Recent Labs Lab 2015/07/21 1308 02/24/16 0055  AST 62* 196*  ALT 20 44  ALKPHOS 151* 104  BILITOT 0.7 0.7  ALBUMIN 1.3* <1.0*    Cardiac Enzymes  Recent Labs Lab 2015/07/21 1308  TROPONINI 0.07*    Glucose  Recent Labs Lab 02/24/16 0440 02/24/16 0644 02/24/16 0718 02/24/16 0753 02/24/16 0902 02/24/16 1007  GLUCAP 51* 38* 77 138* 143* 137*    CXR: emphysema, no acute infiltrates   ASSESSMENT / PLAN:  PULMONARY A: Acute respiratory distress due to metabolic acidosis - improving Former smoker, suspected COPD P:   Supplemental O2 to maintain SpO2 > 92% PRN bronchodilators for wheezing DNI  CARDIOVASCULAR A:  Septic shock P:  Cont low dose  vasopressors to maintain MAP > 65 mmHg DNR, no ACLS  RENAL A:   AKI, nonoliguric - Cr improving Severe metabolic acidosis with elevated lactate P:   Cont HCO3 infusion Monitor BMET intermittently Monitor I/Os Correct electrolytes as indicated  GASTROINTESTINAL A:   Alcoholic cirrhosis Recent UGIB Chronic PPI therapy P:   SUP: IV famotidine Transition to PO PPI when able NPO until cognition improves  HEMATOLOGIC A:   Chronic anemia without overt acute blood  loss Coagulopathy due to ESLD P:  DVT px: coagulopathic Monitor CBC/coags intermittently Transfuse per usual guidelines  INFECTIOUS A:   Severe sepsis - unclear source.   Doubt SBP   Possible R chest cellulitis/fasciitis  Possible other skin source - multiple excoriations and skin breaks PCN allergy - has tolerated ceftriaxone previosuly P:   Monitor temp, WBC count Micro and abx as above PCT algorithm  ENDOCRINE A:   Stress induced hyperglycemia without prior hx of DM recurernt hypoglycemia due to AKI and ESLD P:   CBGs q 4 hrs Dextrose added to IVFs  NEUROLOGIC A:   Acute encephalopathy - septic, hepatic P:   RASS goal: 0 Minimize sedative analgesics   FAMILY  02/11/2016: I spoke with son-in-law in person and subsequently with pt's daughter over phone ([place] in [place] on business). They both are in agreement that, in light of Ms Tersigni's failing health over the past six months and profoundly debilitated state at baseline, no heroic measures such as intubation, mechanical ventilation, ACLS/CPR or hemodialysis would be appropriate at this time.   11/14:     Billy Fischeravid Simonds, MD PCCM service Mobile [phone] Pager [phone]     02/24/2016, 11:40 AM

## 2016-02-24 NOTE — Progress Notes (Signed)
Pharmacy Antibiotic Note  Joann Huang is a 82 y.o. female admitted on 02/27/2016 with sepsis of unknown source.  Pharmacy has been consulted for Vancomycin and ceftriaxone dosing. Patient received Vancomycin 1000 mg IV x1 and vancomycin 500 mg IV x 1 for a 25 mg/kg loading dose, levofloxacin 750mg  x1 and aztreonam 2gm x1 in ED.   Plan: Pts renal function is improving with a baseline SCr ~0.8. Today SCr is 2.14. Will begin patient on vancomycin 1250 mg IV q 48 hours starting tonight ~24 hours after last dose (stacked dosing). VT ordered prior to 3rd dose which will be 3 days of vanc therapy. Pharmacy will continue to monitor renal function for improvement and dose vancomycin accordingly. CMP ordered for am. PK: Vd = 47.18   Ke = 0.22  T 1/2 = 31.5  Patient was initiated on aztreonam 1gm IV every 12 hours. Patient has allergy listed for PCN which is reported to be hives but patient received ceftriaxone on a recent admission. Per rounds, will transition patient to ceftriaxone 2 g IV q 24 hours.  Weight: 148 lb 9.4 oz (67.4 kg)  Temp (24hrs), Avg:96.9 F (36.1 C), Min:95 F (35 C), Max:99.9 F (37.7 C)   Recent Labs Lab 03/10/2016 0055 02/29/2016 1307 02/16/2016 1308 03/07/2016 1651 03/05/2016 2058 02/24/16 0055 02/24/16 0808  WBC  --   --  22.9*  --   --  16.6*  --   CREATININE  --   --  2.53*  --   --  2.14*  --   LATICACIDVEN 11.4* 15.0*  --  10.9* 11.9*  --  15.3*    Estimated Creatinine Clearance: 18.6 mL/min (by C-G formula based on SCr of 2.14 mg/dL (H)).    Allergies  Allergen Reactions  . Sulfa Antibiotics Hives and Swelling  . Penicillins Hives and Other (See Comments)    Has patient had a PCN reaction causing immediate rash, facial/tongue/throat swelling, SOB or lightheadedness with hypotension: yes Has patient had a PCN reaction causing severe rash involving mucus membranes or skin necrosis: No Has patient had a PCN reaction that required hospitalization No Has patient had a  PCN reaction occurring within the last 10 years: No If all of the above answers are "NO", then may proceed with Cephalosporin use.    Antimicrobials this admission: 11/13 levofloxacin >> 11/13 11/13 Azreonam >> 11/14 11/13 vancomycin >> 11/14 ceftriaxone >>   Dose adjustments this admission:  Microbiology results: 11/13 BCx: NG < 24 horus 11/13 UCx: Sent 11/13 MRSA PCR: positive  Thank you for allowing pharmacy to be a part of this patient's care.  Horris LatinoHolly Gilliam, PharmD Pharmacy Resident 02/24/2016 11:55 AM

## 2016-02-24 NOTE — NC FL2 (Signed)
[place] MEDICAID FL2 LEVEL OF CARE SCREENING TOOL     IDENTIFICATION  Patient Name: Joann Huang Birthdate: [birth year] Sex: female Admission Date (Current Location): 02/18/2016  [place]   Facility and Address:  [facility], [address], [place], [place] [address]      Provider Number: [id number]  Attending Physician Name and Address:  Merwyn Katosavid B Simonds, MD  Relative Name and Phone Number:       Current Level of Care: Hospital Recommended Level of Care: Skilled Nursing Facility Prior Approval Number:    Date Approved/Denied:   PASRR Number: [id number]  Discharge Plan: SNF    Current Diagnoses: Patient Active Problem List   Diagnosis Date Noted  . Severe sepsis (HCC) 02/19/2016  . Liver cirrhosis (HCC) 11/26/2015  . Fall   . Melena   . Acute blood loss anemia   . Duodenal ulcer 11/25/2015  . GI bleed 11/25/2015  . Leukocytosis 11/25/2015  . Elevated troponin 11/25/2015  . GERD (gastroesophageal reflux disease) 11/25/2015  . Weakness of both lower extremities 11/01/2015  . UTI (urinary tract infection) - with encephalopathy 11/01/2015  . Essential hypertension 11/01/2015  . Metabolic encephalopathy - related to UTI 11/01/2015  . NSTEMI (non-ST elevated myocardial infarction) (HCC) 10/31/2015    Orientation RESPIRATION BLADDER Height & Weight        Normal, O2 (3 liters) Incontinent Weight: 148 lb 9.4 oz (67.4 kg) Height:     BEHAVIORAL SYMPTOMS/MOOD NEUROLOGICAL BOWEL NUTRITION STATUS   (none)   Incontinent Diet (currently npo; to be advanced)  AMBULATORY STATUS COMMUNICATION OF NEEDS Skin   Total Care Non-Verbally Normal                       Personal Care Assistance Level of Assistance  Total care           Functional Limitations Info             SPECIAL CARE FACTORS FREQUENCY                       Contractures Contractures Info: Not present    Additional Factors  Info  Allergies Code Status Info: dnr Allergies Info: sulfa abx, pcns           Current Medications (02/24/2016):  This is the current hospital active medication list Current Facility-Administered Medications  Medication Dose Route Frequency Provider Last Rate Last Dose  . 0.9 %  sodium chloride infusion  250 mL Intravenous PRN Merwyn Katosavid B Simonds, MD      . cefTRIAXone (ROCEPHIN) IVPB 2 g  2 g Intravenous Q24H Merwyn Katosavid B Simonds, MD      . chlorhexidine (PERIDEX) 0.12 % solution 15 mL  15 mL Mouth Rinse BID Merwyn Katosavid B Simonds, MD   15 mL at 02/13/2016 2235  . Chlorhexidine Gluconate Cloth 2 % PADS 6 each  6 each Topical Q0600 Merwyn Katosavid B Simonds, MD   6 each at 02/24/16 [phone]  . famotidine (PEPCID) IVPB 20 mg premix  20 mg Intravenous Q24H Merwyn Katosavid B Simonds, MD   20 mg at 03/02/2016 2237  . ipratropium-albuterol (DUONEB) 0.5-2.5 (3) MG/3ML nebulizer solution 3 mL  3 mL Nebulization Q4H PRN Merwyn Katosavid B Simonds, MD      . MEDLINE mouth rinse  15 mL Mouth Rinse q12n4p Merwyn Katosavid B Simonds, MD   15 mL at 02/24/16 1200  . mupirocin ointment (BACTROBAN) 2 % 1 application  1  application Nasal BID Merwyn Katosavid B Simonds, MD   1 application at 02/24/16 1100  . ondansetron (ZOFRAN) injection 4 mg  4 mg Intravenous Q6H PRN Merwyn Katosavid B Simonds, MD      . phenylephrine (NEO-SYNEPHRINE) 10 mg in dextrose 5 % 250 mL (0.04 mg/mL) infusion  0-100 mcg/min Intravenous Titrated Merwyn Katosavid B Simonds, MD 45 mL/hr at 02/24/16 1409 30 mcg/min at 02/24/16 1409  . sodium bicarbonate 150 mEq in dextrose 5 % 1,000 mL infusion   Intravenous Continuous Merwyn Katosavid B Simonds, MD 100 mL/hr at 02/24/16 1339    . vancomycin (VANCOCIN) 1,250 mg in sodium chloride 0.9 % 250 mL IVPB  1,250 mg Intravenous Q48H Rolm BaptiseHolly N Gilliam, [facility] SoundRPH         Discharge Medications: Please see discharge summary for a list of discharge medications.  Relevant Imaging Results:  Relevant Lab Results:   Additional Information    York SpanielMonica Emet Rafanan, LCSW

## 2016-02-24 NOTE — Care Management (Signed)
[facility] for sever [facility].  Currently on neo synephrine infusion and bicarb drip.  She is a DNR and not responsive.

## 2016-02-25 ENCOUNTER — Inpatient Hospital Stay: Payer: Medicare Other

## 2016-02-25 LAB — COMPREHENSIVE METABOLIC PANEL
ALK PHOS: 130 U/L — AB (ref 38–126)
ALT: 73 U/L — AB (ref 14–54)
ANION GAP: 28 — AB (ref 5–15)
AST: 252 U/L — ABNORMAL HIGH (ref 15–41)
Albumin: <1 g/dL — ABNORMAL LOW (ref 3.5–5.0)
BILIRUBIN TOTAL: 0.8 mg/dL (ref 0.3–1.2)
BUN: 37 mg/dL — ABNORMAL HIGH (ref 6–20)
CALCIUM: 7.1 mg/dL — AB (ref 8.9–10.3)
CO2: 8 mmol/L — ABNORMAL LOW (ref 22–32)
CREATININE: 2.52 mg/dL — AB (ref 0.44–1.00)
Chloride: 102 mmol/L (ref 101–111)
GFR calc non Af Amer: 17 mL/min — ABNORMAL LOW (ref >60)
GFR, EST AFRICAN AMERICAN: 19 mL/min — AB (ref >60)
GLUCOSE: 77 mg/dL (ref 65–99)
Potassium: 4.2 mmol/L (ref 3.5–5.1)
Sodium: 138 mmol/L (ref 135–145)
TOTAL PROTEIN: 3.4 g/dL — AB (ref 6.5–8.1)

## 2016-02-25 LAB — GLUCOSE, CAPILLARY
GLUCOSE-CAPILLARY: 87 mg/dL (ref 65–99)
Glucose-Capillary: 83 mg/dL (ref 65–99)
Glucose-Capillary: 72 mg/dL (ref 65–99)

## 2016-02-25 LAB — URINE CULTURE: Culture: >=100000 — AB

## 2016-02-25 MED ORDER — VANCOMYCIN HCL IN DEXTROSE 1-5 GM/200ML-% IV SOLN: 1000.0000 mg | INTRAVENOUS | Status: DC

## 2016-02-25 MED ORDER — MORPHINE SULFATE (PF) 4 MG/ML IV SOLN
1.0000 mg | Freq: Once | INTRAVENOUS | Status: AC
  Administered 2016-02-25: 1 mg via INTRAVENOUS
  Filled 2016-02-25: qty 1

## 2016-02-25 MED ORDER — MORPHINE SULFATE (PF) 4 MG/ML IV SOLN: 2.0000 mg | INTRAVENOUS | Status: DC | PRN

## 2016-02-27 ENCOUNTER — Telehealth: Payer: Self-pay | Admitting: Pulmonary Disease

## 2016-02-27 NOTE — Telephone Encounter (Signed)
Death certificate placed in folder for DS to sign.

## 2016-02-27 NOTE — Telephone Encounter (Signed)
Received Death Certificate from [employer].  Placed in Pulm. Nurse Box.

## 2016-02-28 LAB — CULTURE, BLOOD (ROUTINE X 2)
CULTURE: NO GROWTH
Culture: NO GROWTH

## 2016-03-12 NOTE — Progress Notes (Signed)
PULMONARY / CRITICAL CARE MEDICINE   Name: Joann Huang J Weissberg MRN: [id number] DOB: 06/18/1930    ADMISSION DATE:  03/11/2016  PT PROFILE: 58F NH resident with alcoholic cirrhosis, h/o UGIB, adult failure to thrive admitted via ED with AMS, hypotension, hypothermia, tachypnea, metabolic acidosis with elevated lactate, admission diagnosis of severe sepsis/septic shock of unclear source  MAJOR EVENTS/TEST RESULTS: 11/13 CTAP: Moderately motion degraded examination. Cirrhosis. New moderate amount of ascites. Asymmetrically thickened R pectoralis muscle and R chest wall edema/cellulitis 11/13 CT head: No acute abnormality 11/13 PM: phenylephrine initiated to maintain MAP > 65 mmHg 11/14 Remained comatose. Persistent shock on vasopressors. Decreasing Uo 11/15 Oligo-anuric. Rising creatinine. Increasing vasopressor requirements. Remained comatose 11/15 Family updated. Decision for full comfort care made  INDWELLING DEVICES:: LLE intra-osseus device 11/13 >>   MICRO DATA: MRSA PCR 11/13 >> POS Urine 11/13 >> E faecalis Blood 11/13 >>   ANTIMICROBIALS:  Levofloxacin 11/13 X 1 Aztreonam 11/13 >> 11/14 Vanc 11/13 >> 11/15 Ceftriaxone 11/14 >> 11/15  SUBJECTIVE:  RASS -5, mildly tachypneic and labored  VITAL SIGNS: BP (!) 63/53   Pulse (!) 115   Temp 98.2 F (36.8 C)   Resp (!) 32   Wt 138 lb 7.2 oz (62.8 kg)   SpO2 95%   BMI 22.35 kg/m   HEMODYNAMICS:    VENTILATOR SETTINGS:    INTAKE / OUTPUT: I/O last 3 completed shifts: In: 4312.8 [I.V.:3697.8; IV Piggyback:615] Out: 720 [Urine:720]  PHYSICAL EXAMINATION: General: acute and chronically ill appearing, mildly tachypneic Neuro: RASS -5, no spontaneous movement, no withdrawal HEENT: NCAT, no scleral icterus Cardiovascular: reg, no M Chest: Lateral aspect of R breast indurated, minimally erythematous Lungs: clear anteriorly Abdomen: soft, nondistended, diminished to absent BS, no rebound tenderness, no guarding Ext:  extensive ecchymoses and excoriations on all extremities, BLEs in wraps, 2+ symmetric pedal edema Skin: facial and chest telangiectasias   LABS:  BMET  Recent Labs Lab 03/07/2016 1308 02/24/16 0055 05/29/15 0900  NA 136 139 138  K 4.7 3.4* 4.2  CL 105 111 102  CO2 9* 11* 8*  BUN 37* 36* 37*  CREATININE 2.53* 2.14* 2.52*  GLUCOSE 172* 147* 77    Electrolytes  Recent Labs Lab 03/02/2016 1308 02/24/16 0055 05/29/15 0900  CALCIUM 8.0* 6.8* 7.1*    CBC  Recent Labs Lab 02/12/2016 1308 02/24/16 0055  WBC 22.9* 16.6*  HGB 10.2* 8.9*  HCT 34.0* 27.9*  PLT 276 212    Coag's  Recent Labs Lab 02/29/2016 1308 02/24/16 0055  APTT 77* 77*  INR 2.07 2.10    Sepsis Markers  Recent Labs Lab 02/21/2016 1308 02/16/2016 1651 02/22/2016 2058 02/24/16 0055 02/24/16 0808  LATICACIDVEN  --  10.9* 11.9*  --  15.3*  PROCALCITON 51.59  --   --  38.14  --     ABG  Recent Labs Lab 02/12/2016 1305  PHART 7.10*  PCO2ART 20*  PO2ART 135*    Liver Enzymes  Recent Labs Lab 03/06/2016 1308 02/24/16 0055 05/29/15 0900  AST 62* 196* 252*  ALT 20 44 73*  ALKPHOS 151* 104 130*  BILITOT 0.7 0.7 0.8  ALBUMIN 1.3* <1.0* <1.0*    Cardiac Enzymes  Recent Labs Lab 03/05/2016 1308  TROPONINI 0.07*    Glucose  Recent Labs Lab 02/24/16 1330 02/24/16 1554 02/24/16 2033 05/29/15 0100 05/29/15 0351 05/29/15 0736  GLUCAP 126* 99 96 87 83 72    CXR: No edema or consolidation   ASSESSMENT: Severe sepsis - unclear source.  Possible R chest cellulitis/fasciitis Septic shock End stage alcoholic cirrhosis Acute respiratory distress due to metabolic acidosis Former smoker AKI Severe metabolic acidosis with elevated lactate Chronic anemia without overt acute blood loss Coagulopathy due to ESLD Stress induced hyperglycemia without prior hx of DM recurernt hypoglycemia due to AKI and ESLD Acute encephalopathy - septic and/or hepatic P:   Comfort care   Billy Fischeravid Tylerjames Hoglund,  MD PCCM service Mobile [phone] Pager [phone]   02/24/2016, 12:20 PM

## 2016-03-12 NOTE — Progress Notes (Signed)
Family at bedside have been updated by Dr Sung AmabileSimonds regarding care and change of code status, family is agreeable with pt being comfort care, new orders obtained, drips d/c. E-link and  Central Telemetry called and notified of pt's code status and change in care

## 2016-03-12 NOTE — H&P (Deleted)
Dr Sung AmabileSimonds aware of pt's low SBP in the 50s and 40s, also aware of pt's neosynephrine dose of 1350mcg/min, new order obtained to keep max dose of pressor at 5150mcg/min. MD to talk with family regarding pt's goals and code status.

## 2016-03-12 NOTE — Progress Notes (Signed)
CH responded to an OR for End of Life. Pt was non responsive and had already transitioned to comfort care. Daughter and Son-In-Law were bed side and were emotional but stable. CH provided the ministry of presence, and prayer. CH left the family to spend time together with Pt. Pt expired a few minutes later.    2016-04-04 1400  Clinical Encounter Type  Visited With Patient;Patient and family together  Visit Type Initial;Spiritual support;Critical Care;Death;Patient actively dying  Referral From Nurse  Consult/Referral To Chaplain  Spiritual Encounters  Spiritual Needs Prayer;Emotional;Grief support  Stress Factors  Patient Stress Factors Exhausted;Health changes;Major life changes

## 2016-03-12 NOTE — Discharge Summary (Signed)
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 Son in law was called on the day of admission and informed that [facility]. He is unable to provide any further history. EMS placed a L intra-osseus access device. She was unable to provide further history to the ED physician and to me. In the ED, she was found to be lethargic, tachypneic, hypothermic and hypotension. She is admitted with a presumed diagnosis of severe sepsis and septic shock with MODS. There are multiple potential sites of infection  HOSPITAL COURSE:   MAJOR EVENTS/TEST  RESULTS: 11/13 CTAP: Moderately motion degraded examination. Cirrhosis. New moderate amount of ascites. Asymmetrically thickened R pectoralis muscle and R chest wall edema/cellulitis 11/13 CT head: No acute abnormality 11/13 PM: phenylephrine initiated to maintain MAP > 65 mmHg 11/13 on admission, the following was documented: I spoke with son-in-law in person and subsequently with pt's daughter over phone (she is in [place] on business). They both are in agreement that, in light of Ms Mells's failing health over the past six months and profoundly debilitated state at baseline, no heroic measures such as intubation, mechanical ventilation, ACLS/CPR or hemodialysis would be appropriate at this time.  11/14 Comatose. Persistent shock on vasopressors. Decreasing Uo 11/15 Oligo-anuric. Rising creatinine. Increasing vasopressor requirements. Comatose 11/15 Family updated. Decision for full comfort care made  MICRO DATA: MRSA PCR 11/13 >> POSITIVE Urine 11/13 >> E faecalis Blood 11/13 >> NGTD  ANTIMICROBIALS:  Levofloxacin 11/13 X 1 Aztreonam 11/13 >> 11/14 Vanc 11/13 >> 11/15 Ceftriaxone 11/14 >> 11/15  Cause of death: Severe sepsis with septic shock  Contributing factors: Severe alcoholic cirrhosis, adult failure to thrive  Autopsy: No   Billy Fischeravid Tayari Yankee, MD PCCM service Mobile [phone] Pager [phone] September 19, 2015

## 2016-03-12 NOTE — Progress Notes (Signed)
Pharmacy Antibiotic Note  Joann Huang is a 82 y.o. female admitted on 09-21-2015 with sepsis of unknown source.  Pharmacy has been consulted for Vancomycin and ceftriaxone dosing. Patient received Vancomycin 1000 mg IV x1 and vancomycin 500 mg IV x 1 for a 25 mg/kg loading dose, levofloxacin 750mg  x1 and aztreonam 2gm x1 in ED.   Plan: Pt's UCx revealed enterococcus faecalis, sensitive to ampicillin, nitrofurantoin, vancomycin. Pt has penicillin allergy. Per rounds, MD will f/u to de-escalate antibiotics.  Pts renal function was originally improving but has declined since yesterday. Decreased vancomycin dose from 1250 mg IV q 48 hours to 1000 mg IV q 48. VT ordered prior to 3rd dose which will be 3 days of vanc therapy. Pharmacy will continue to monitor renal function for improvement and dose vancomycin accordingly. CMP ordered for am.  Continue ceftriaxone 2 g IV q 24 hours.    Weight: 138 lb 7.2 oz (62.8 kg)  Temp (24hrs), Avg:96.8 F (36 C), Min:95.4 F (35.2 C), Max:98.8 F (37.1 C)   Recent Labs Lab 2015/11/01 0055 2015/11/01 1307 2015/11/01 1308 2015/11/01 1651 2015/11/01 2058 02/24/16 0055 02/24/16 0808 02/18/2016 0900  WBC  --   --  22.9*  --   --  16.6*  --   --   CREATININE  --   --  2.53*  --   --  2.14*  --  2.52*  LATICACIDVEN 11.4* 15.0*  --  10.9* 11.9*  --  15.3*  --     Estimated Creatinine Clearance: 15.8 mL/min (by C-G formula based on SCr of 2.52 mg/dL (H)).    Allergies  Allergen Reactions  . Sulfa Antibiotics Hives and Swelling  . Penicillins Hives and Other (See Comments)    Has patient had a PCN reaction causing immediate rash, facial/tongue/throat swelling, SOB or lightheadedness with hypotension: yes Has patient had a PCN reaction causing severe rash involving mucus membranes or skin necrosis: No Has patient had a PCN reaction that required hospitalization No Has patient had a PCN reaction occurring within the last 10 years: No If all of the above answers  are "NO", then may proceed with Cephalosporin use.    Antimicrobials this admission: 11/13 levofloxacin >> 11/13 11/13 Azreonam >> 11/14 11/13 vancomycin >> 11/14 ceftriaxone >>  Dose adjustments this admission:  Microbiology results: 11/13 BCx: NG 2 days 11/13 UCx: E. faecalis 11/13 MRSA PCR: positive  Thank you for allowing pharmacy to be a part of this patient's care.  Horris LatinoHolly Gilliam, PharmD Pharmacy Resident 03/02/2016 12:03 PM

## 2016-03-12 NOTE — Progress Notes (Addendum)
MD called and notified of pt expiring at 1315, pt assessed with Brittney RN, PT unresponsive, pupils fixed and dilated, no palpable pulse, no BP, no heart sounds, no breathing, asystole on the monitor and no lung sounds. Family at bedside during assessment and notified of pt's time of death. CDS called by charge nurse, supervisor Annice PihJackie called and notified as well. Lines d/c. No concerns at this time.

## 2016-03-12 NOTE — Progress Notes (Signed)
Dr Simonds aware of pt's low SBP in the 50s and 40s, also aware of pt's neosynephrine dose of 50mcg/min, new order obtained to keep max dose of pressor at 50mcg/min. MD to talk with family regarding pt's goals and code status. 

## 2016-03-12 DEATH — deceased

## 2017-06-18 IMAGING — DX DG CHEST 1V PORT
1 series · 1 of 1 positions shown · non-contrast
Comparison: 10/31/2015

CLINICAL DATA: Passing bright red blood per rectum.  Tachycardia.

EXAM:
PORTABLE CHEST 1 VIEW

[chest ap]
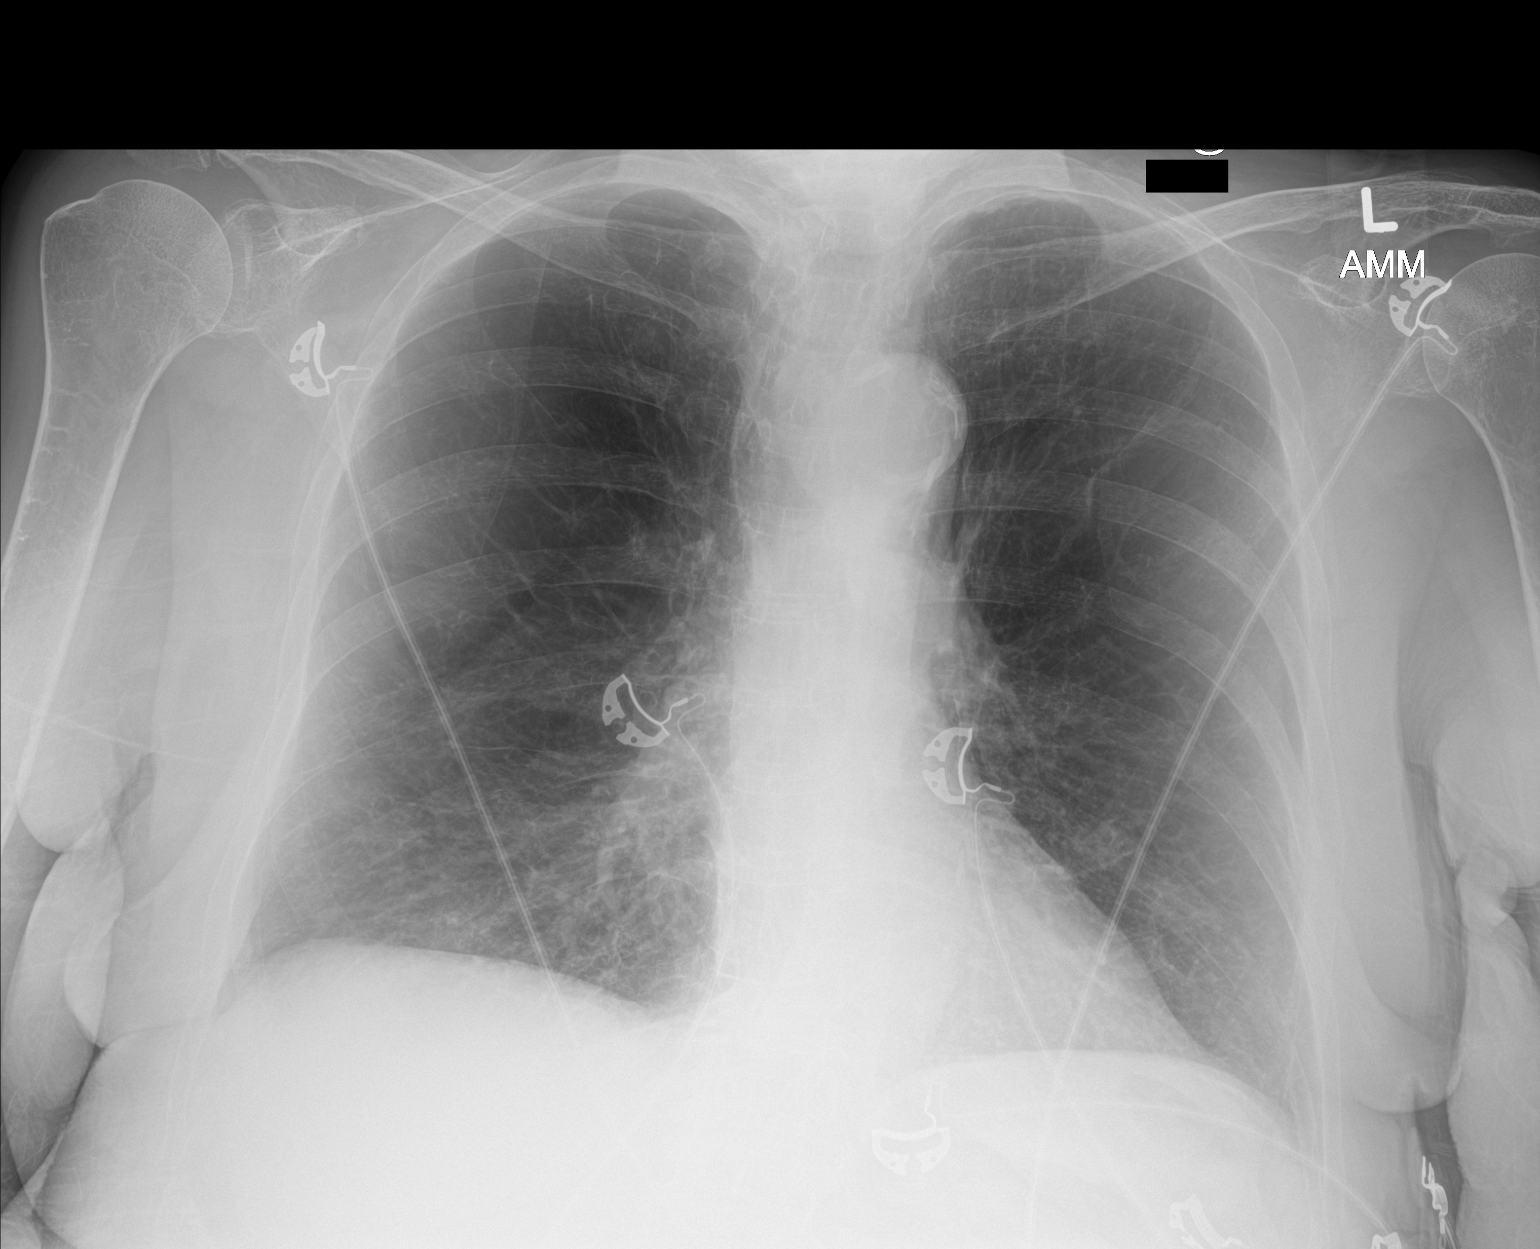

[1 of 1 positions shown; findings below may reference images not displayed]

FINDINGS: Emphysematous changes in the lungs with scattered fibrosis. Normal
heart size and pulmonary vascularity. No focal airspace disease or
consolidation in the lungs. No blunting of costophrenic angles. No
pneumothorax. Mediastinal contours appear intact. Calcification of
the aorta.
IMPRESSION: Prominent emphysematous changes in the lungs. No evidence of active
pulmonary disease.

## 2017-09-06 IMAGING — CT CT HEAD W/O CM
3 series · 16 of 46 positions shown, 19 images · non-contrast
Comparison: Head CT scan 10/31/2015.

CLINICAL DATA: Altered mental status. Decreased responsiveness.
Sepsis.

EXAM:
CT HEAD WITHOUT CONTRAST
TECHNIQUE: Contiguous axial images were obtained from the base of the skull
through the vertex without intravenous contrast.

[Series 2: head wo · axial · 0.43mm/px · z∈[+535,+655]mm · 10 of 29 slices shown, 13 images]
[im 3/29  brain]
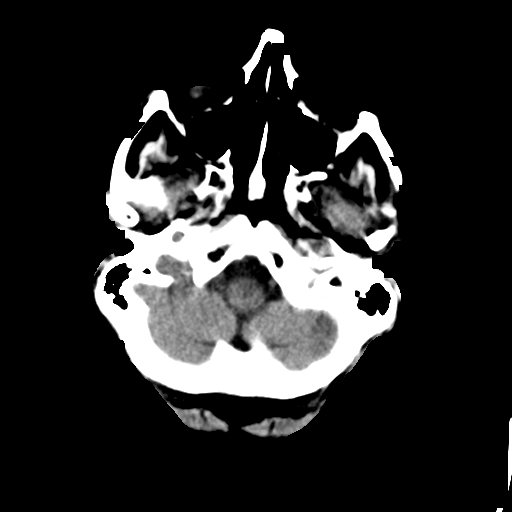
[im 3/29  bone]
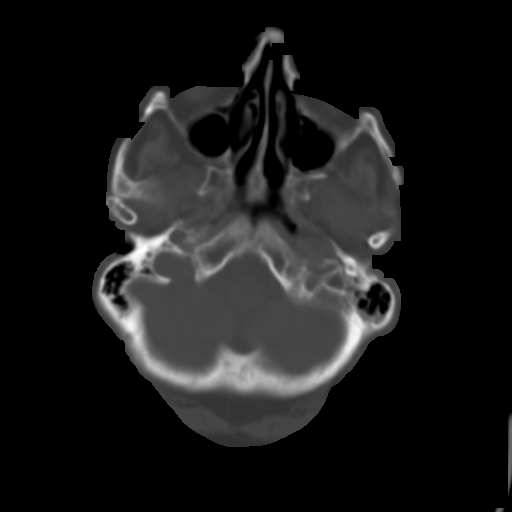
[im 6/29  brain]
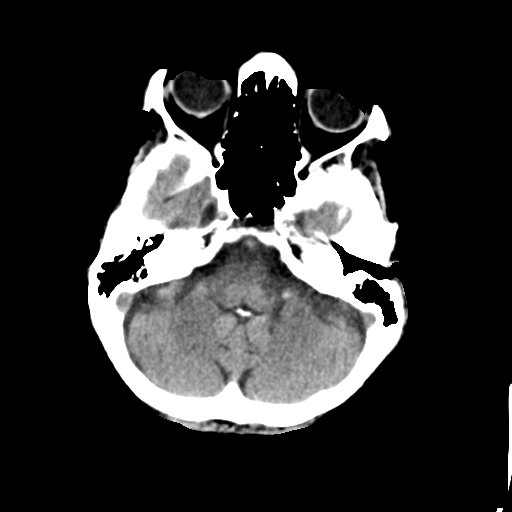
[im 8/29  brain]
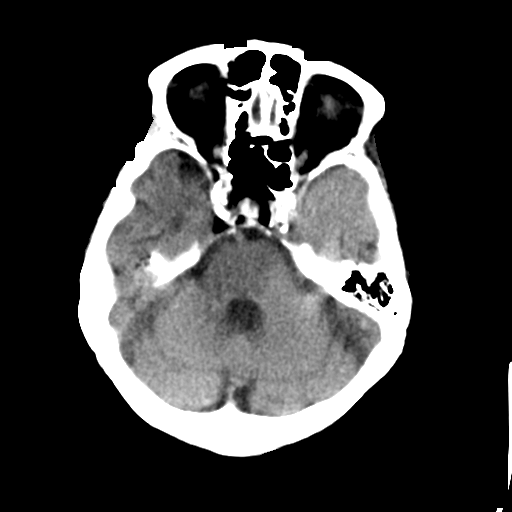
[im 11/29  brain]
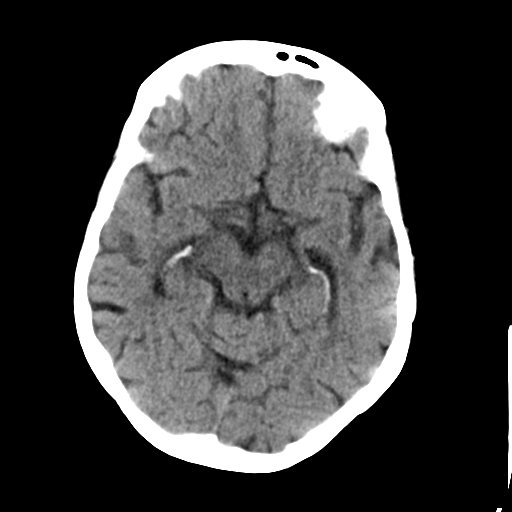
[im 14/29  brain]
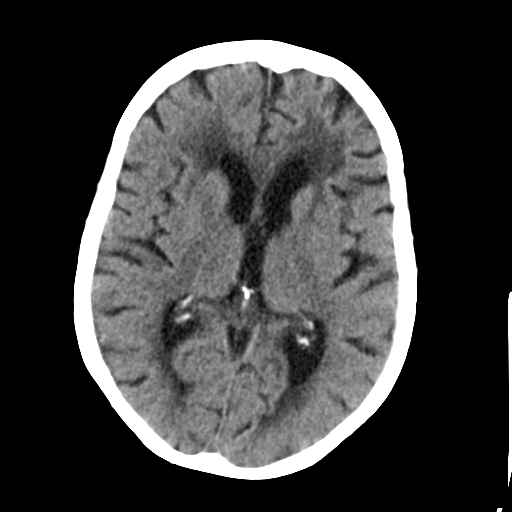
[im 14/29  bone]
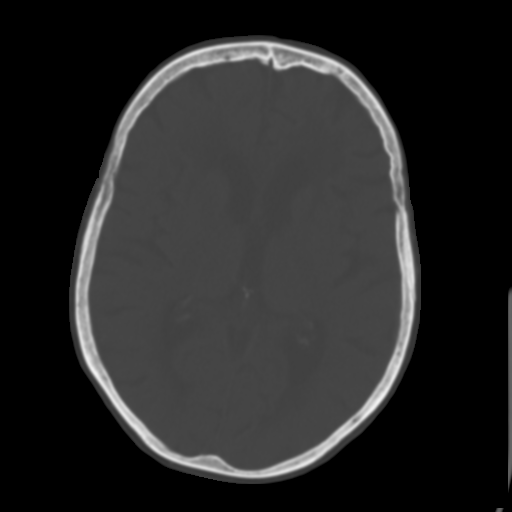
[im 16/29  brain]
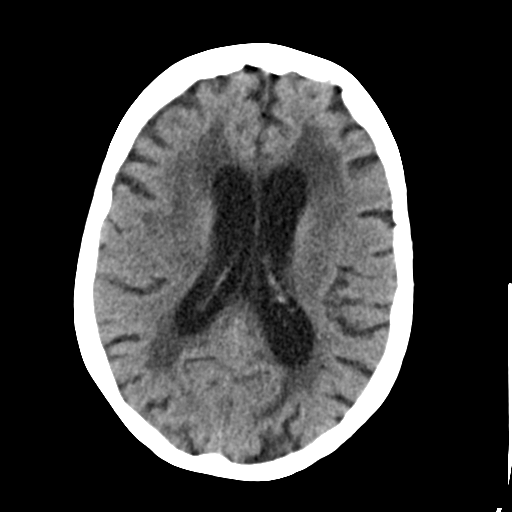
[im 19/29  brain]
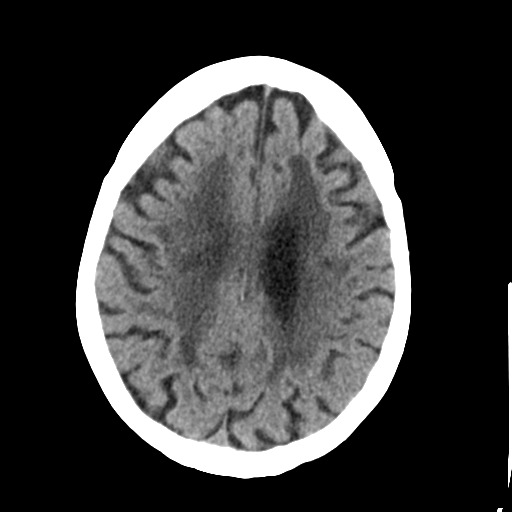
[im 22/29  brain]
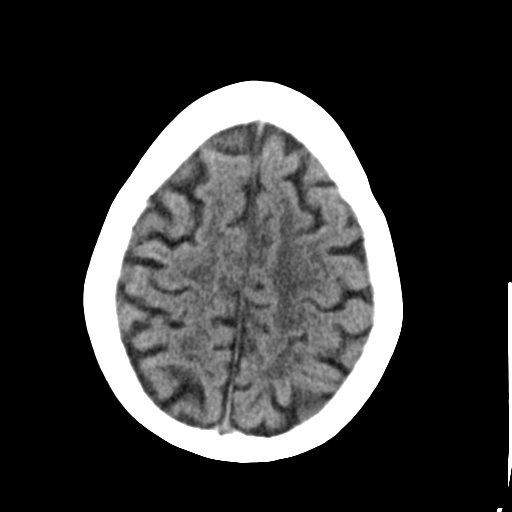
[im 24/29  brain]
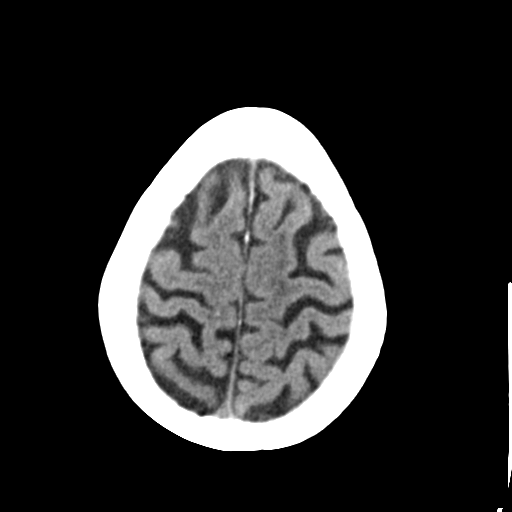
[im 24/29  bone]
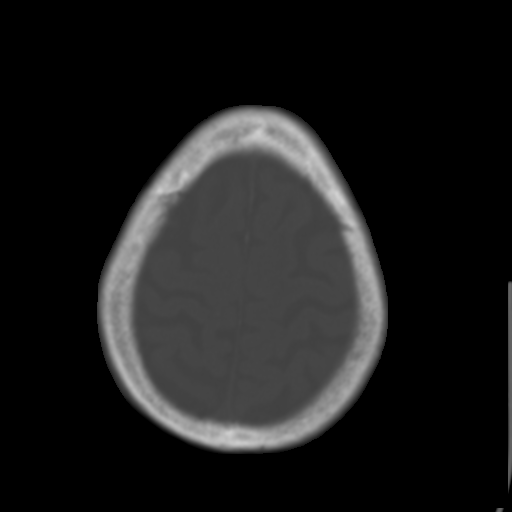
[im 27/29  brain]
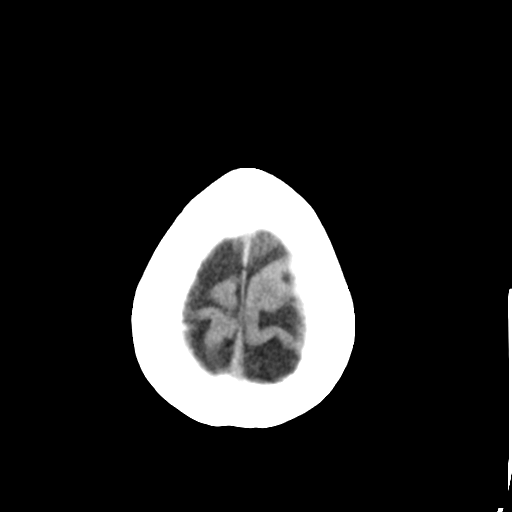

[Series 4: coronal soft tissue · coronal · 0.31mm/px · 3 of 61 slices shown]
[im 21/61  brain]
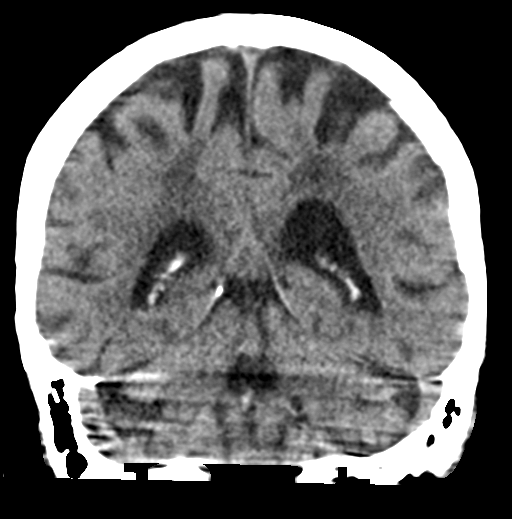
[im 27/61  brain]
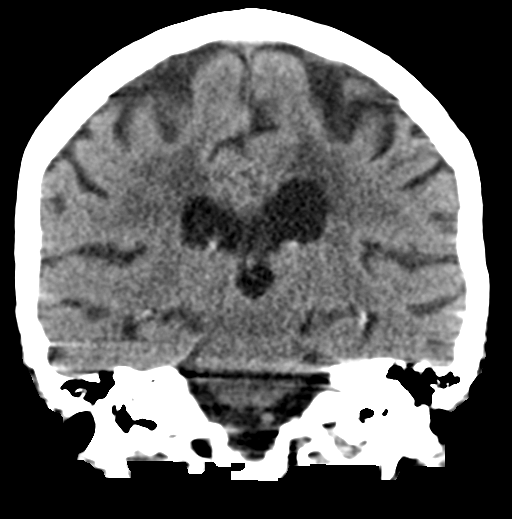
[im 34/61  brain]
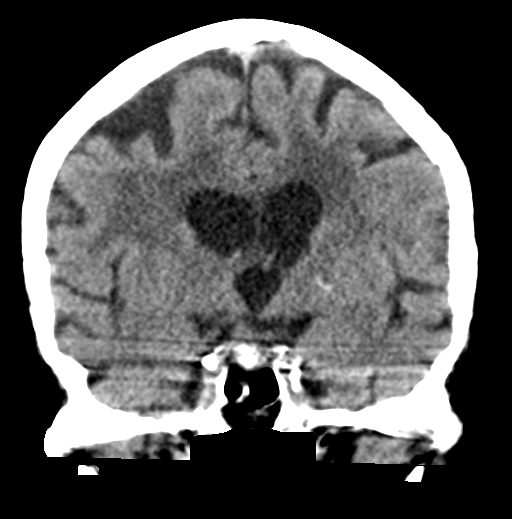

[Series 5: sagittal soft tissue · sagittal · 0.31mm/px · 3 of 48 slices shown]
[im 16/48  brain]
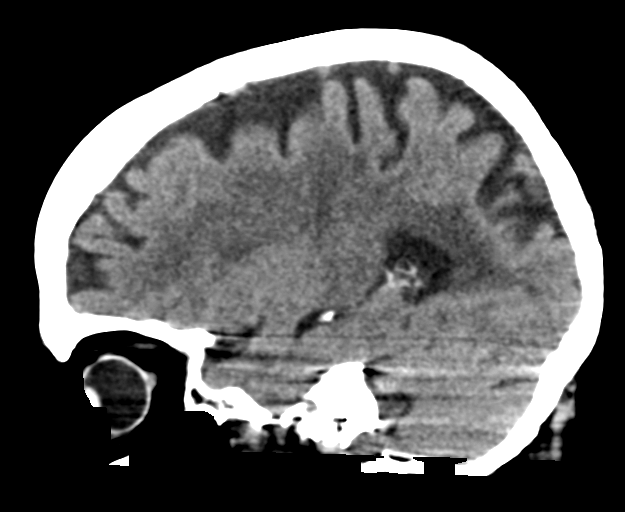
[im 24/48  brain]
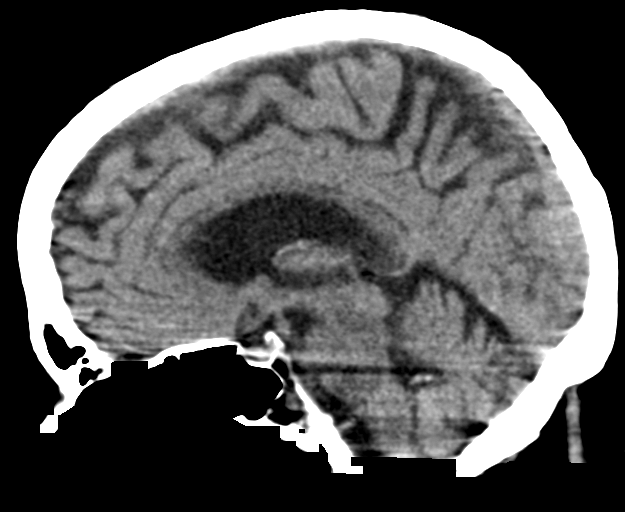
[im 32/48  brain]
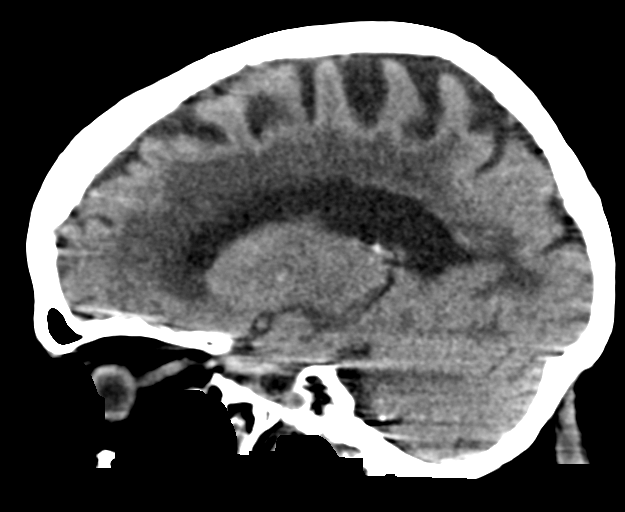

[16 of 46 positions shown; findings below may reference images not displayed]

FINDINGS: Brain: Atrophy and extensive chronic microvascular ischemic change
are again seen. No evidence of acute abnormality including
hemorrhage, infarct, mass lesion, mass effect, midline shift or
abnormal extra-axial fluid collection. No hydrocephalus or
pneumocephalus.

Vascular: Atherosclerosis noted.

Skull: Intact.

Sinuses/Orbits: Unremarkable.

Other: None.  New
IMPRESSION: No acute abnormality.

Atrophy and chronic microvascular ischemic change.

## 2017-09-07 IMAGING — DX DG CHEST 1V PORT
1 series · 1 of 1 positions shown · non-contrast
Comparison: Portable chest x-ray February 23, 2016

CLINICAL DATA: Respiratory failure

EXAM:
PORTABLE CHEST 1 VIEW

[chest ap]
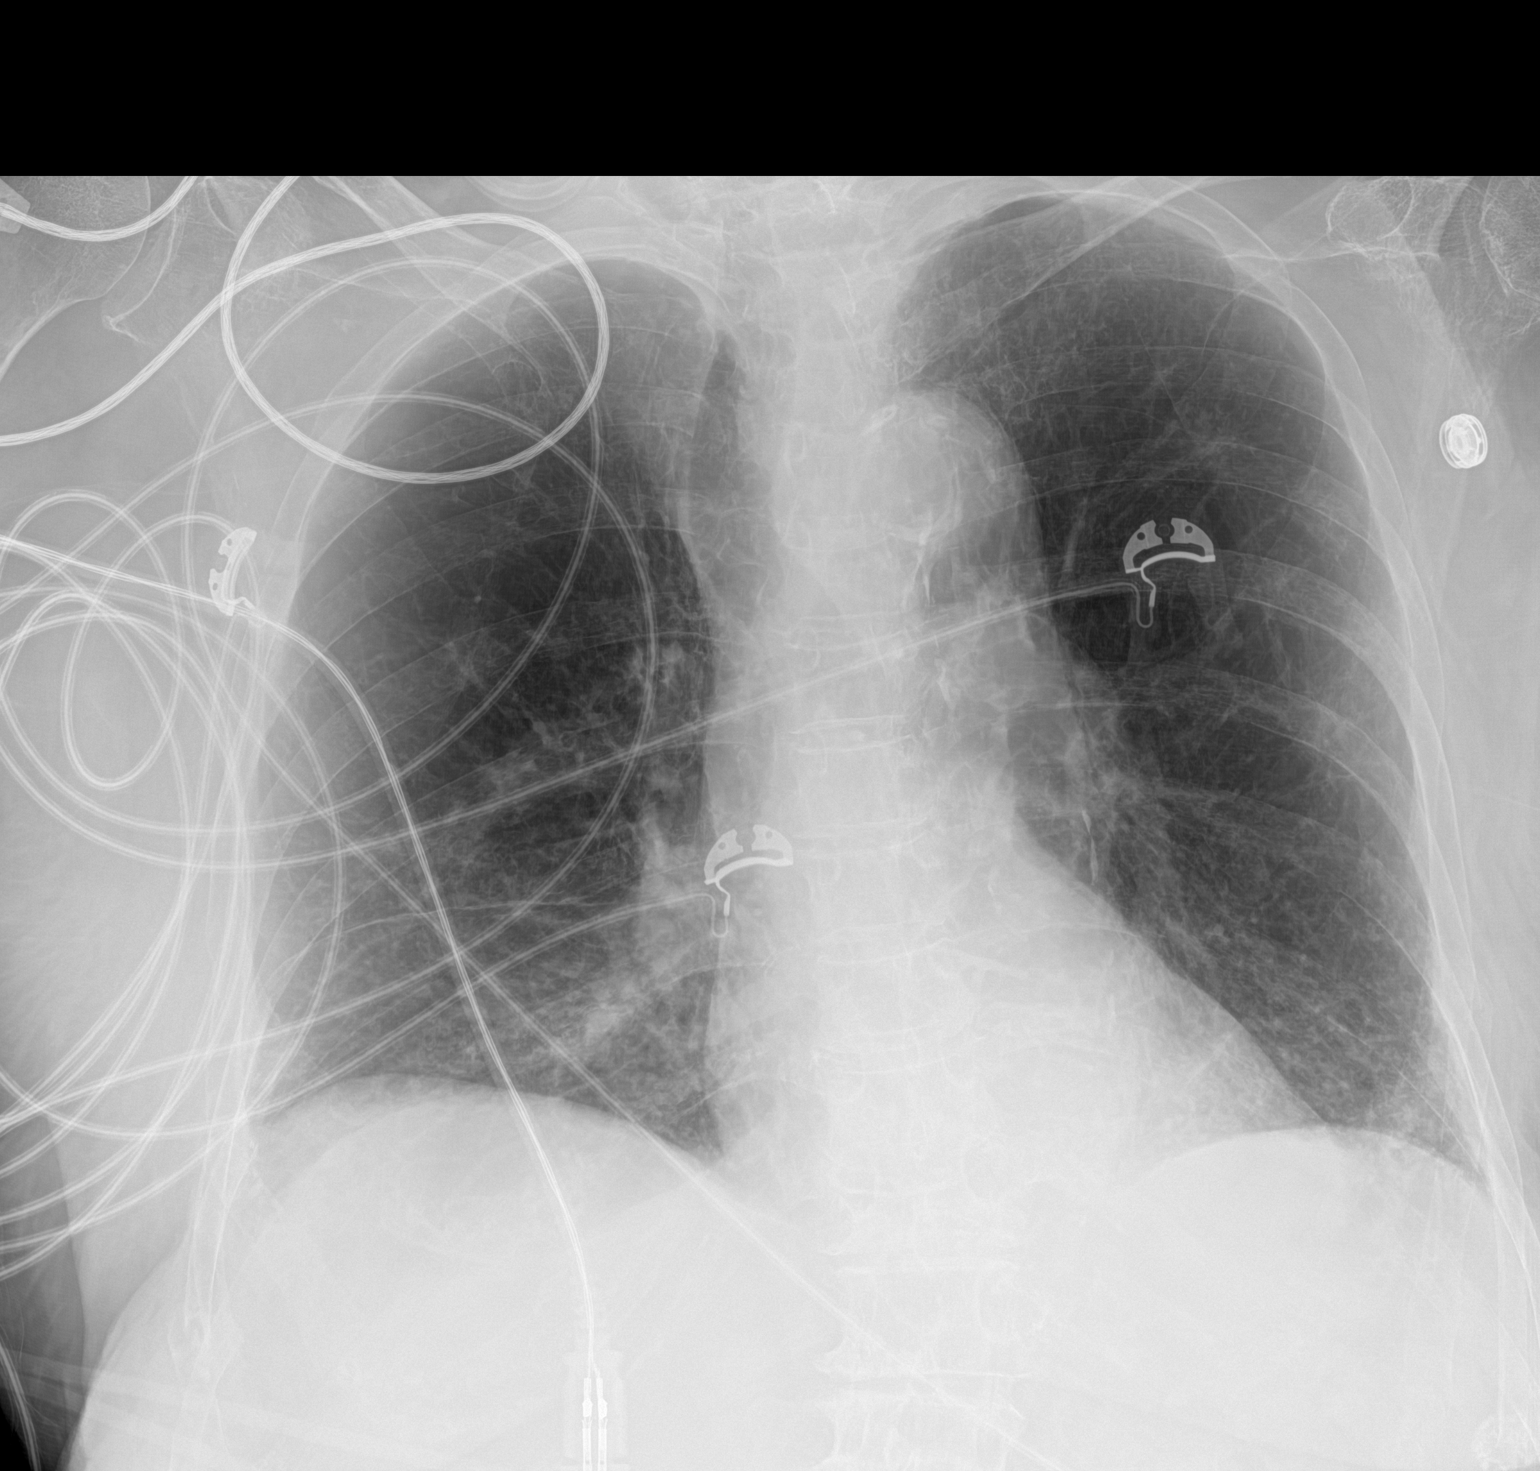

[1 of 1 positions shown; findings below may reference images not displayed]

FINDINGS: The lungs remain hyperinflated. The interstitial markings are coarse
but stable. The heart and pulmonary vascularity are normal. The
mediastinum is normal in width. There is calcification in the wall
of the thoracic aorta. There is no pleural effusion. The observed
bony thorax exhibits no acute abnormality.
IMPRESSION: COPD. No pneumonia, CHF, nor other acute cardiopulmonary
abnormality.

Aortic atherosclerosis.
